# Patient Record
Sex: Male | Born: 1965 | Race: White | Hispanic: No | Smoking: Never smoker
Health system: Southern US, Community
[De-identification: ages and names within clinical notes are randomized; demographics above are authoritative.]

## PROBLEM LIST (undated history)

## (undated) DIAGNOSIS — F32A Depression, unspecified: Secondary | ICD-10-CM

## (undated) DIAGNOSIS — S2239XA Fracture of one rib, unspecified side, initial encounter for closed fracture: Secondary | ICD-10-CM

## (undated) DIAGNOSIS — M1990 Unspecified osteoarthritis, unspecified site: Secondary | ICD-10-CM

## (undated) DIAGNOSIS — Z87442 Personal history of urinary calculi: Secondary | ICD-10-CM

## (undated) DIAGNOSIS — F329 Major depressive disorder, single episode, unspecified: Secondary | ICD-10-CM

## (undated) DIAGNOSIS — I10 Essential (primary) hypertension: Secondary | ICD-10-CM

## (undated) DIAGNOSIS — S2249XA Multiple fractures of ribs, unspecified side, initial encounter for closed fracture: Secondary | ICD-10-CM

## (undated) DIAGNOSIS — K219 Gastro-esophageal reflux disease without esophagitis: Secondary | ICD-10-CM

## (undated) HISTORY — PX: JOINT REPLACEMENT: SHX530

## (undated) HISTORY — PX: OTHER SURGICAL HISTORY: SHX169

## (undated) HISTORY — PX: REFRACTIVE SURGERY: SHX103

## (undated) HISTORY — PX: NASAL SINUS SURGERY: SHX719

---

## 2001-01-06 ENCOUNTER — Inpatient Hospital Stay (HOSPITAL_COMMUNITY): Admission: EM | Admit: 2001-01-06 | Discharge: 2001-01-07 | Payer: Self-pay | Admitting: *Deleted

## 2001-01-06 ENCOUNTER — Inpatient Hospital Stay (HOSPITAL_COMMUNITY): Admission: EM | Admit: 2001-01-06 | Discharge: 2001-01-06 | Payer: Self-pay | Admitting: *Deleted

## 2001-01-17 ENCOUNTER — Inpatient Hospital Stay (HOSPITAL_COMMUNITY): Admission: EM | Admit: 2001-01-17 | Discharge: 2001-01-18 | Payer: Self-pay | Admitting: *Deleted

## 2001-01-24 ENCOUNTER — Inpatient Hospital Stay (HOSPITAL_COMMUNITY): Admission: EM | Admit: 2001-01-24 | Discharge: 2001-01-29 | Payer: Self-pay | Admitting: Psychiatry

## 2001-01-26 ENCOUNTER — Emergency Department (HOSPITAL_COMMUNITY): Admission: EM | Admit: 2001-01-26 | Discharge: 2001-01-26 | Payer: Self-pay | Admitting: *Deleted

## 2013-07-23 ENCOUNTER — Encounter (HOSPITAL_COMMUNITY): Payer: Self-pay | Admitting: Pharmacy Technician

## 2013-07-23 NOTE — Patient Instructions (Signed)
FLEM ENDERLE  07/23/2013   Your procedure is scheduled on:  07/28/13               Surgery 220pm-350pm  Report to [facility] at     1120  AM.  Call this number if you have problems the morning of surgery: [phone]   Remember:             May have clear liquids until 0730am then npo.    Do not eat food after midnite.   Take these medicines the morning of surgery with A SIP OF WATER:    Do not wear jewelry,   Do not wear lotions, powders, or perfumes. .   Men may shave face and neck.  Do not bring valuables to the hospital.  Contacts, dentures or bridgework may not be worn into surgery.  Leave suitcase in the car. After surgery it may be brought to your room.  For patients admitted to the hospital, checkout time is 11:00 AM the day of  discharge.   SEE CHG INSTRUCTION SHEET    Please read over the following fact sheets that you were given: MRSA Information, coughing and deep breathing exercises, leg exercises, Blood Transfusion Fact sheet, Incentive Spirometry Fact Sheet.                 Failure to comply with these instructions may result in cancellation of your surgery.                Patient Signature ____________________________              Nurse Signature _____________________________

## 2013-07-24 ENCOUNTER — Encounter (HOSPITAL_COMMUNITY): Payer: Self-pay

## 2013-07-24 ENCOUNTER — Ambulatory Visit (HOSPITAL_COMMUNITY)
Admission: RE | Admit: 2013-07-24 | Discharge: 2013-07-24 | Disposition: A | Discharge status: Home / Self Care | Payer: BC Managed Care – PPO | Source: Ambulatory Visit | Attending: Orthopedic Surgery | Admitting: Orthopedic Surgery

## 2013-07-24 ENCOUNTER — Other Ambulatory Visit: Payer: Self-pay

## 2013-07-24 ENCOUNTER — Encounter (HOSPITAL_COMMUNITY)
Admission: RE | Admit: 2013-07-24 | Discharge: 2013-07-24 | Disposition: A | Discharge status: Home / Self Care | Payer: BC Managed Care – PPO | Source: Ambulatory Visit | Attending: Orthopedic Surgery | Admitting: Orthopedic Surgery

## 2013-07-24 DIAGNOSIS — I517 Cardiomegaly: Secondary | ICD-10-CM | POA: Insufficient documentation

## 2013-07-24 DIAGNOSIS — Z01818 Encounter for other preprocedural examination: Secondary | ICD-10-CM | POA: Insufficient documentation

## 2013-07-24 DIAGNOSIS — I10 Essential (primary) hypertension: Secondary | ICD-10-CM | POA: Insufficient documentation

## 2013-07-24 DIAGNOSIS — Z01810 Encounter for preprocedural cardiovascular examination: Secondary | ICD-10-CM | POA: Insufficient documentation

## 2013-07-24 DIAGNOSIS — Z0183 Encounter for blood typing: Secondary | ICD-10-CM | POA: Insufficient documentation

## 2013-07-24 DIAGNOSIS — R9431 Abnormal electrocardiogram [ECG] [EKG]: Secondary | ICD-10-CM | POA: Insufficient documentation

## 2013-07-24 DIAGNOSIS — Z01812 Encounter for preprocedural laboratory examination: Secondary | ICD-10-CM | POA: Insufficient documentation

## 2013-07-24 HISTORY — DX: Depression, unspecified: F32.A

## 2013-07-24 HISTORY — DX: Multiple fractures of ribs, unspecified side, initial encounter for closed fracture: S22.49XA

## 2013-07-24 HISTORY — DX: Major depressive disorder, single episode, unspecified: F32.9

## 2013-07-24 HISTORY — DX: Essential (primary) hypertension: I10

## 2013-07-24 HISTORY — DX: Unspecified osteoarthritis, unspecified site: M19.90

## 2013-07-24 HISTORY — DX: Gastro-esophageal reflux disease without esophagitis: K21.9

## 2013-07-24 HISTORY — DX: Fracture of one rib, unspecified side, initial encounter for closed fracture: S22.39XA

## 2013-07-24 LAB — BASIC METABOLIC PANEL
BUN: 7 mg/dL (ref 6–23)
CO2: 28 mEq/L (ref 19–32)
Calcium: 9.5 mg/dL (ref 8.4–10.5)
Chloride: 100 mEq/L (ref 96–112)
Creatinine, Ser: 1.02 mg/dL (ref 0.50–1.35)
GFR calc Af Amer: >90 mL/min (ref >90)
GFR calc non Af Amer: 85 mL/min — ABNORMAL LOW (ref >90)
Glucose, Bld: 90 mg/dL (ref 70–99)
Potassium: 3.7 mEq/L (ref 3.5–5.1)
Sodium: 138 mEq/L (ref 135–145)

## 2013-07-24 LAB — URINALYSIS, ROUTINE W REFLEX MICROSCOPIC
Bilirubin Urine: NEGATIVE
Ketones, ur: NEGATIVE mg/dL
Leukocytes, UA: NEGATIVE
Nitrite: NEGATIVE
Specific Gravity, Urine: 1.025 (ref 1.005–1.030)
Urobilinogen, UA: 0.2 mg/dL (ref 0.0–1.0)
pH: 5.5 (ref 5.0–8.0)

## 2013-07-24 LAB — CBC
HCT: 39 % (ref 39.0–52.0)
MCV: 85.2 fL (ref 78.0–100.0)
Platelets: 235 10*3/uL (ref 150–400)
RBC: 4.58 MIL/uL (ref 4.22–5.81)
WBC: 9 10*3/uL (ref 4.0–10.5)

## 2013-07-24 LAB — ABO/RH: ABO/RH(D): O POS

## 2013-07-24 LAB — SURGICAL PCR SCREEN: MRSA, PCR: NEGATIVE

## 2013-07-24 NOTE — H&P (Signed)
TOTAL HIP ADMISSION H&P  Patient is admitted for left total hip arthroplasty, anterior approach.  Subjective:  Chief Complaint: Left hip AVN / pain  HPI: Raymond [name], 47 y.o. 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  Onset of symptoms was abrupt starting 5 months ago with rapidlly worsening course since that time.The patient noted no past surgery on the left hip(s).  Patient currently rates pain in the left hip at 10 out of 10 with activity. Patient has night pain, worsening of pain with activity and weight bearing, trendelenberg gait, pain that interfers with activities of daily living and pain with passive range of motion. Patient has evidence of periarticular osteophytes, joint space narrowing and AVN by imaging studies. This condition presents safety issues increasing the risk of falls.  There is no current active infection.  Risks, benefits and expectations were discussed with the patient. Patient understand the risks, benefits and expectations and wishes to proceed with surgery.   D/C Plans:   Home with HHPT  Post-op Meds:   Rx given for ASA, Robaxin, Iron, Colace and MiraLax  Tranexamic Acid:   To be given  Decadron:    To be given  FYI:    ASA post-op   Past Medical History  Diagnosis Date  . Hypertension   . Depression   . Kidney stones   . GERD (gastroesophageal reflux disease)   . Arthritis   . Rib fractures     left side 06/2013     Past Surgical History  Procedure Laterality Date  . Kidney stone removal     . Refractive surgery    . Right shoulder surgery     . Nasal sinus surgery       No Known Allergies   History  Substance Use Topics  . Smoking status: Never Smoker   . Smokeless tobacco: Current User    Types: Chew  .  Alcohol Use: Yes     Comment: occASional beer       Review of Systems  Constitutional: Negative.   HENT: Negative.   Eyes: Negative.   Respiratory: Negative.   Cardiovascular: Negative.   Gastrointestinal: Positive for heartburn.  Genitourinary: Negative.   Musculoskeletal: Positive for back pain and joint pain.  Skin: Negative.   Neurological: Negative.   Endo/Heme/Allergies: Negative.   Psychiatric/Behavioral: Negative.     Objective:  Physical Exam  Constitutional: He is oriented to person, place, and time. He appears well-developed and well-nourished.  HENT:  Head: Normocephalic and atraumatic.  Mouth/Throat: Oropharynx is clear and moist. He has dentures.  Eyes: Pupils are equal, round, and reactive to light.  Neck: Neck supple. No JVD present. No tracheal deviation present. No thyromegaly present.  Cardiovascular: Normal rate, regular rhythm, normal heart sounds and intact distal pulses.   Respiratory: Effort normal and breath sounds normal. No stridor. No respiratory distress. He has no wheezes.  GI: Soft. There is no tenderness. There is no guarding.  Musculoskeletal:       Left hip: He exhibits decreased range of motion, decreased strength, tenderness and bony tenderness. He exhibits no swelling, no deformity and no laceration.  Lymphadenopathy:    He has no cervical adenopathy.  Neurological: He is alert and oriented to person, place, and time.  Skin: Skin is warm and  dry.  Psychiatric: He has a normal mood and affect.    Vital signs in last 24 hours: Temp:  [98.3 F (36.8 C)] 98.3 F (36.8 C) (08/08 0946) Pulse Rate:  [94] 94 (08/08 0946) Resp:  [16] 16 (08/08 0946) BP: (149)/(96) 149/96 mmHg (08/08 0946) SpO2:  [97 %] 97 % (08/08 0946) Weight:  [88.27 kg (194 lb 9.6 oz)] 88.27 kg (194 lb 9.6 oz) (08/08 0946)   Imaging Review Plain radiographs demonstrate severe degenerative joint disease of the left hip(s). The bone quality appears to be good for age  and reported activity level.  Assessment/Plan:  End stage arthritis, left hip(s)  The patient history, physical examination, clinical judgement of the provider and imaging studies are consistent with end stage degenerative joint disease of the left hip(s) and total hip arthroplasty is deemed medically necessary. The treatment options including medical management, injection therapy, arthroscopy and arthroplasty were discussed at length. The risks and benefits of total hip arthroplasty were presented and reviewed. The risks due to aseptic loosening, infection, stiffness, dislocation/subluxation,  thromboembolic complications and other imponderables were discussed.  The patient acknowledged the explanation, agreed to proceed with the plan and consent was signed. Patient is being admitted for inpatient treatment for surgery, pain control, PT, OT, prophylactic antibiotics, VTE prophylaxis, progressive ambulation and ADL's and discharge planning.The patient is planning to be discharged home with home health services.    Anastasio Auerbach Anureet Bruington   PAC  07/24/2013, 11:45 AM

## 2013-07-24 NOTE — Progress Notes (Signed)
CxR results faxed via EPIC to Dr Charlann Boxer.

## 2013-07-28 ENCOUNTER — Ambulatory Visit (HOSPITAL_COMMUNITY): Payer: BC Managed Care – PPO

## 2013-07-28 ENCOUNTER — Encounter (HOSPITAL_COMMUNITY): Payer: Self-pay | Admitting: *Deleted

## 2013-07-28 ENCOUNTER — Ambulatory Visit (HOSPITAL_COMMUNITY): Payer: BC Managed Care – PPO | Admitting: Anesthesiology

## 2013-07-28 ENCOUNTER — Inpatient Hospital Stay (HOSPITAL_COMMUNITY)
Admission: RE | Admit: 2013-07-28 | Discharge: 2013-07-29 | DRG: 818 | Disposition: A | Discharge status: Home Health | Payer: BC Managed Care – PPO | Source: Ambulatory Visit | Attending: Orthopedic Surgery | Admitting: Orthopedic Surgery

## 2013-07-28 ENCOUNTER — Encounter (HOSPITAL_COMMUNITY)
Admission: RE | Disposition: A | Discharge status: Home Health | Payer: Self-pay | Source: Ambulatory Visit | Attending: Orthopedic Surgery

## 2013-07-28 ENCOUNTER — Encounter (HOSPITAL_COMMUNITY): Payer: Self-pay | Admitting: Anesthesiology

## 2013-07-28 DIAGNOSIS — I10 Essential (primary) hypertension: Secondary | ICD-10-CM | POA: Diagnosis present

## 2013-07-28 DIAGNOSIS — E663 Overweight: Secondary | ICD-10-CM

## 2013-07-28 DIAGNOSIS — M169 Osteoarthritis of hip, unspecified: Secondary | ICD-10-CM | POA: Diagnosis present

## 2013-07-28 DIAGNOSIS — D62 Acute posthemorrhagic anemia: Secondary | ICD-10-CM | POA: Diagnosis not present

## 2013-07-28 DIAGNOSIS — Z96642 Presence of left artificial hip joint: Secondary | ICD-10-CM

## 2013-07-28 DIAGNOSIS — Z96649 Presence of unspecified artificial hip joint: Secondary | ICD-10-CM

## 2013-07-28 DIAGNOSIS — D500 Iron deficiency anemia secondary to blood loss (chronic): Secondary | ICD-10-CM | POA: Diagnosis not present

## 2013-07-28 DIAGNOSIS — Z87442 Personal history of urinary calculi: Secondary | ICD-10-CM

## 2013-07-28 DIAGNOSIS — M1610 Unilateral primary osteoarthritis, unspecified hip: Secondary | ICD-10-CM | POA: Diagnosis present

## 2013-07-28 DIAGNOSIS — K219 Gastro-esophageal reflux disease without esophagitis: Secondary | ICD-10-CM | POA: Diagnosis present

## 2013-07-28 DIAGNOSIS — Z6825 Body mass index (BMI) 25.0-25.9, adult: Secondary | ICD-10-CM

## 2013-07-28 DIAGNOSIS — F3289 Other specified depressive episodes: Secondary | ICD-10-CM | POA: Diagnosis present

## 2013-07-28 DIAGNOSIS — M87059 Idiopathic aseptic necrosis of unspecified femur: Principal | ICD-10-CM | POA: Diagnosis present

## 2013-07-28 DIAGNOSIS — F329 Major depressive disorder, single episode, unspecified: Secondary | ICD-10-CM | POA: Diagnosis present

## 2013-07-28 HISTORY — PX: TOTAL HIP ARTHROPLASTY: SHX124

## 2013-07-28 LAB — TYPE AND SCREEN: Antibody Screen: NEGATIVE

## 2013-07-28 SURGERY — ARTHROPLASTY, HIP, TOTAL, ANTERIOR APPROACH
Anesthesia: Spinal | Site: Hip | Laterality: Left | Wound class: Clean

## 2013-07-28 MED ORDER — HYDROMORPHONE HCL PF 1 MG/ML IJ SOLN
INTRAMUSCULAR | Status: AC
  Filled 2013-07-28: qty 1

## 2013-07-28 MED ORDER — POLYETHYLENE GLYCOL 3350 17 G PO PACK
17.0000 g | PACK | Freq: Two times a day (BID) | ORAL | Status: DC
  Administered 2013-07-28 – 2013-07-29 (×2): 17 g via ORAL

## 2013-07-28 MED ORDER — CELECOXIB 200 MG PO CAPS
200.0000 mg | ORAL_CAPSULE | Freq: Two times a day (BID) | ORAL | Status: DC
  Administered 2013-07-28 – 2013-07-29 (×2): 200 mg via ORAL
  Filled 2013-07-28 (×3): qty 1

## 2013-07-28 MED ORDER — FLEET ENEMA 7-19 GM/118ML RE ENEM: 1.0000 | ENEMA | Freq: Once | RECTAL | Status: AC | PRN

## 2013-07-28 MED ORDER — CITRIC ACID-SODIUM CITRATE 334-500 MG/5ML PO SOLN
ORAL | Status: AC
  Filled 2013-07-28: qty 15

## 2013-07-28 MED ORDER — EPHEDRINE SULFATE 50 MG/ML IJ SOLN
INTRAMUSCULAR | Status: DC | PRN
  Administered 2013-07-28 (×2): 5 mg via INTRAVENOUS

## 2013-07-28 MED ORDER — METOCLOPRAMIDE HCL 5 MG/ML IJ SOLN
5.0000 mg | Freq: Three times a day (TID) | INTRAMUSCULAR | Status: DC | PRN
Start: 2013-07-28 — End: 2013-07-29

## 2013-07-28 MED ORDER — LACTATED RINGERS IV SOLN: INTRAVENOUS | Status: DC

## 2013-07-28 MED ORDER — DIPHENHYDRAMINE HCL 25 MG PO CAPS: 25.0000 mg | ORAL_CAPSULE | Freq: Four times a day (QID) | ORAL | Status: DC | PRN

## 2013-07-28 MED ORDER — CEFAZOLIN SODIUM-DEXTROSE 2-3 GM-% IV SOLR
2.0000 g | Freq: Four times a day (QID) | INTRAVENOUS | Status: AC
  Administered 2013-07-28 – 2013-07-29 (×2): 2 g via INTRAVENOUS
  Filled 2013-07-28 (×2): qty 50

## 2013-07-28 MED ORDER — METOCLOPRAMIDE HCL 10 MG PO TABS: 5.0000 mg | ORAL_TABLET | Freq: Three times a day (TID) | ORAL | Status: DC | PRN

## 2013-07-28 MED ORDER — METHOCARBAMOL 500 MG PO TABS
500.0000 mg | ORAL_TABLET | Freq: Four times a day (QID) | ORAL | Status: DC | PRN
  Administered 2013-07-29 (×2): 500 mg via ORAL
  Filled 2013-07-28 (×2): qty 1

## 2013-07-28 MED ORDER — CHLORHEXIDINE GLUCONATE 4 % EX LIQD
60.0000 mL | Freq: Once | CUTANEOUS | Status: DC
  Filled 2013-07-28: qty 60

## 2013-07-28 MED ORDER — 0.9 % SODIUM CHLORIDE (POUR BTL) OPTIME
TOPICAL | Status: DC | PRN
  Administered 2013-07-28: 1000 mL

## 2013-07-28 MED ORDER — PHENOL 1.4 % MT LIQD: 1.0000 | OROMUCOSAL | Status: DC | PRN

## 2013-07-28 MED ORDER — DEXTROSE 5 % IV SOLN
500.0000 mg | Freq: Four times a day (QID) | INTRAVENOUS | Status: DC | PRN
  Administered 2013-07-28: 500 mg via INTRAVENOUS
  Filled 2013-07-28 (×2): qty 5

## 2013-07-28 MED ORDER — MEPERIDINE HCL 50 MG/ML IJ SOLN: 6.2500 mg | INTRAMUSCULAR | Status: DC | PRN

## 2013-07-28 MED ORDER — CITRIC ACID-SODIUM CITRATE 334-500 MG/5ML PO SOLN
ORAL | Status: DC | PRN
  Administered 2013-07-28: 15 mL via ORAL

## 2013-07-28 MED ORDER — HYDROMORPHONE HCL PF 1 MG/ML IJ SOLN
0.5000 mg | INTRAMUSCULAR | Status: DC | PRN
  Administered 2013-07-28: 1 mg via INTRAVENOUS
  Administered 2013-07-28: 0.5 mg via INTRAVENOUS
  Administered 2013-07-28 (×2): 1 mg via INTRAVENOUS
  Administered 2013-07-29: 2 mg via INTRAVENOUS
  Filled 2013-07-28 (×3): qty 1
  Filled 2013-07-28: qty 2

## 2013-07-28 MED ORDER — ATORVASTATIN CALCIUM 40 MG PO TABS
40.0000 mg | ORAL_TABLET | Freq: Every evening | ORAL | Status: DC
  Administered 2013-07-28 – 2013-07-29 (×2): 40 mg via ORAL
  Filled 2013-07-28 (×2): qty 1

## 2013-07-28 MED ORDER — BUPIVACAINE HCL (PF) 0.5 % IJ SOLN
INTRAMUSCULAR | Status: DC | PRN
  Administered 2013-07-28: 3 mL

## 2013-07-28 MED ORDER — BUPIVACAINE HCL (PF) 0.5 % IJ SOLN
INTRAMUSCULAR | Status: AC
  Filled 2013-07-28: qty 30

## 2013-07-28 MED ORDER — DEXAMETHASONE SODIUM PHOSPHATE 10 MG/ML IJ SOLN: 10.0000 mg | Freq: Once | INTRAMUSCULAR | Status: DC

## 2013-07-28 MED ORDER — CEFAZOLIN SODIUM-DEXTROSE 2-3 GM-% IV SOLR
2.0000 g | INTRAVENOUS | Status: AC
  Administered 2013-07-28: 2 g via INTRAVENOUS

## 2013-07-28 MED ORDER — PANTOPRAZOLE SODIUM 40 MG PO TBEC
80.0000 mg | DELAYED_RELEASE_TABLET | Freq: Every day | ORAL | Status: DC
  Administered 2013-07-29: 80 mg via ORAL
  Filled 2013-07-28: qty 2

## 2013-07-28 MED ORDER — DEXAMETHASONE SODIUM PHOSPHATE 10 MG/ML IJ SOLN
INTRAMUSCULAR | Status: DC | PRN
  Administered 2013-07-28: 10 mg via INTRAVENOUS

## 2013-07-28 MED ORDER — HYDROCODONE-ACETAMINOPHEN 7.5-325 MG PO TABS
1.0000 | ORAL_TABLET | ORAL | Status: DC
  Administered 2013-07-28 – 2013-07-29 (×4): 2 via ORAL
  Filled 2013-07-28 (×4): qty 2

## 2013-07-28 MED ORDER — PROMETHAZINE HCL 25 MG/ML IJ SOLN: 6.2500 mg | INTRAMUSCULAR | Status: DC | PRN

## 2013-07-28 MED ORDER — SODIUM CHLORIDE 0.9 % IV SOLN
100.0000 mL/h | INTRAVENOUS | Status: DC
  Administered 2013-07-28 – 2013-07-29 (×3): 100 mL/h via INTRAVENOUS
  Filled 2013-07-28 (×9): qty 1000

## 2013-07-28 MED ORDER — MIDAZOLAM HCL 5 MG/5ML IJ SOLN
INTRAMUSCULAR | Status: DC | PRN
  Administered 2013-07-28 (×2): 1 mg via INTRAVENOUS

## 2013-07-28 MED ORDER — DOCUSATE SODIUM 100 MG PO CAPS
100.0000 mg | ORAL_CAPSULE | Freq: Two times a day (BID) | ORAL | Status: DC
  Administered 2013-07-28 – 2013-07-29 (×2): 100 mg via ORAL

## 2013-07-28 MED ORDER — TRANEXAMIC ACID 100 MG/ML IV SOLN
1000.0000 mg | Freq: Once | INTRAVENOUS | Status: AC
  Administered 2013-07-28: 1000 mg via INTRAVENOUS
  Filled 2013-07-28: qty 10

## 2013-07-28 MED ORDER — ONDANSETRON HCL 4 MG PO TABS: 4.0000 mg | ORAL_TABLET | Freq: Four times a day (QID) | ORAL | Status: DC | PRN

## 2013-07-28 MED ORDER — MENTHOL 3 MG MT LOZG: 1.0000 | LOZENGE | OROMUCOSAL | Status: DC | PRN

## 2013-07-28 MED ORDER — BISACODYL 10 MG RE SUPP: 10.0000 mg | Freq: Every day | RECTAL | Status: DC | PRN

## 2013-07-28 MED ORDER — PROPOFOL INFUSION 10 MG/ML OPTIME
INTRAVENOUS | Status: DC | PRN
  Administered 2013-07-28: 75 ug/kg/min via INTRAVENOUS

## 2013-07-28 MED ORDER — PHENYLEPHRINE HCL 10 MG/ML IJ SOLN
INTRAMUSCULAR | Status: DC | PRN
  Administered 2013-07-28: 80 ug via INTRAVENOUS
  Administered 2013-07-28: 40 ug via INTRAVENOUS

## 2013-07-28 MED ORDER — ASPIRIN EC 325 MG PO TBEC
325.0000 mg | DELAYED_RELEASE_TABLET | Freq: Two times a day (BID) | ORAL | Status: DC
  Administered 2013-07-29: 325 mg via ORAL
  Filled 2013-07-28 (×3): qty 1

## 2013-07-28 MED ORDER — DEXAMETHASONE SODIUM PHOSPHATE 10 MG/ML IJ SOLN
10.0000 mg | Freq: Once | INTRAMUSCULAR | Status: AC
  Administered 2013-07-29: 10 mg via INTRAVENOUS
  Filled 2013-07-28: qty 1

## 2013-07-28 MED ORDER — CEFAZOLIN SODIUM-DEXTROSE 2-3 GM-% IV SOLR
INTRAVENOUS | Status: AC
  Filled 2013-07-28: qty 50

## 2013-07-28 MED ORDER — ZOLPIDEM TARTRATE 5 MG PO TABS: 5.0000 mg | ORAL_TABLET | Freq: Every evening | ORAL | Status: DC | PRN

## 2013-07-28 MED ORDER — ALUM & MAG HYDROXIDE-SIMETH 200-200-20 MG/5ML PO SUSP: 30.0000 mL | ORAL | Status: DC | PRN

## 2013-07-28 MED ORDER — ONDANSETRON HCL 4 MG/2ML IJ SOLN
INTRAMUSCULAR | Status: DC | PRN
  Administered 2013-07-28: 4 mg via INTRAVENOUS

## 2013-07-28 MED ORDER — VERAPAMIL HCL ER 100 MG PO CP24: 100.0000 mg | ORAL_CAPSULE | Freq: Every day | ORAL | Status: DC

## 2013-07-28 MED ORDER — VERAPAMIL HCL ER 100 MG PO CP24
100.0000 mg | ORAL_CAPSULE | Freq: Every day | ORAL | Status: DC
  Filled 2013-07-28: qty 1

## 2013-07-28 MED ORDER — HYDROMORPHONE HCL PF 1 MG/ML IJ SOLN
INTRAMUSCULAR | Status: DC | PRN
  Administered 2013-07-28 (×2): 1 mg via INTRAVENOUS

## 2013-07-28 MED ORDER — METOCLOPRAMIDE HCL 5 MG/ML IJ SOLN
INTRAMUSCULAR | Status: DC | PRN
  Administered 2013-07-28: 10 mg via INTRAVENOUS

## 2013-07-28 MED ORDER — PHENYLEPHRINE HCL 10 MG/ML IJ SOLN
20.0000 mg | INTRAVENOUS | Status: DC | PRN
  Administered 2013-07-28: 25 ug/min via INTRAVENOUS

## 2013-07-28 MED ORDER — STERILE WATER FOR IRRIGATION IR SOLN
Status: DC | PRN
  Administered 2013-07-28: 3000 mL

## 2013-07-28 MED ORDER — ALPRAZOLAM 0.5 MG PO TABS
0.5000 mg | ORAL_TABLET | Freq: Once | ORAL | Status: AC
  Administered 2013-07-28: 0.5 mg via ORAL
  Filled 2013-07-28: qty 1

## 2013-07-28 MED ORDER — ONDANSETRON HCL 4 MG/2ML IJ SOLN: 4.0000 mg | Freq: Four times a day (QID) | INTRAMUSCULAR | Status: DC | PRN

## 2013-07-28 MED ORDER — LACTATED RINGERS IV SOLN
INTRAVENOUS | Status: DC
  Administered 2013-07-28: 1000 mL via INTRAVENOUS

## 2013-07-28 MED ORDER — HYDROMORPHONE HCL PF 1 MG/ML IJ SOLN: 0.2500 mg | INTRAMUSCULAR | Status: DC | PRN

## 2013-07-28 MED ORDER — VERAPAMIL HCL ER 120 MG PO TBCR
120.0000 mg | EXTENDED_RELEASE_TABLET | Freq: Every day | ORAL | Status: AC
  Administered 2013-07-28: 120 mg via ORAL
  Filled 2013-07-28: qty 1

## 2013-07-28 MED ORDER — FERROUS SULFATE 325 (65 FE) MG PO TABS
325.0000 mg | ORAL_TABLET | Freq: Three times a day (TID) | ORAL | Status: DC
  Administered 2013-07-28 – 2013-07-29 (×4): 325 mg via ORAL
  Filled 2013-07-28 (×5): qty 1

## 2013-07-28 SURGICAL SUPPLY — 38 items
BAG ZIPLOCK 12X15 (MISCELLANEOUS) ×4 IMPLANT
BLADE SAW SGTL 18X1.27X75 (BLADE) ×2 IMPLANT
CAPT HIP PF COP ×2 IMPLANT
CLOTH BEACON ORANGE TIMEOUT ST (SAFETY) ×2 IMPLANT
DERMABOND ADVANCED (GAUZE/BANDAGES/DRESSINGS) ×1
DERMABOND ADVANCED .7 DNX12 (GAUZE/BANDAGES/DRESSINGS) ×1 IMPLANT
DRAPE C-ARM 42X120 X-RAY (DRAPES) ×2 IMPLANT
DRAPE STERI IOBAN 125X83 (DRAPES) ×2 IMPLANT
DRAPE U-SHAPE 47X51 STRL (DRAPES) ×6 IMPLANT
DRSG AQUACEL AG ADV 3.5X10 (GAUZE/BANDAGES/DRESSINGS) ×2 IMPLANT
DRSG TEGADERM 4X4.75 (GAUZE/BANDAGES/DRESSINGS) ×2 IMPLANT
DURAPREP 26ML APPLICATOR (WOUND CARE) ×2 IMPLANT
ELECT BLADE TIP CTD 4 INCH (ELECTRODE) ×2 IMPLANT
ELECT REM PT RETURN 9FT ADLT (ELECTROSURGICAL) ×2
ELECTRODE REM PT RTRN 9FT ADLT (ELECTROSURGICAL) ×1 IMPLANT
EVACUATOR 1/8 PVC DRAIN (DRAIN) ×2 IMPLANT
FACESHIELD LNG OPTICON STERILE (SAFETY) ×10 IMPLANT
GAUZE SPONGE 2X2 8PLY STRL LF (GAUZE/BANDAGES/DRESSINGS) ×1 IMPLANT
GLOVE BIOGEL PI IND STRL 7.5 (GLOVE) ×1 IMPLANT
GLOVE BIOGEL PI IND STRL 8 (GLOVE) ×1 IMPLANT
GLOVE BIOGEL PI INDICATOR 7.5 (GLOVE) ×1
GLOVE BIOGEL PI INDICATOR 8 (GLOVE) ×1
GLOVE ECLIPSE 8.0 STRL XLNG CF (GLOVE) ×2 IMPLANT
GLOVE ORTHO TXT STRL SZ7.5 (GLOVE) ×4 IMPLANT
GOWN BRE IMP PREV XXLGXLNG (GOWN DISPOSABLE) ×2 IMPLANT
GOWN STRL NON-REIN LRG LVL3 (GOWN DISPOSABLE) ×2 IMPLANT
KIT BASIN OR (CUSTOM PROCEDURE TRAY) ×2 IMPLANT
PACK TOTAL JOINT (CUSTOM PROCEDURE TRAY) ×2 IMPLANT
PADDING CAST COTTON 6X4 STRL (CAST SUPPLIES) ×2 IMPLANT
SPONGE GAUZE 2X2 STER 10/PKG (GAUZE/BANDAGES/DRESSINGS) ×1
SUCTION FRAZIER 12FR DISP (SUCTIONS) ×2 IMPLANT
SUT MNCRL AB 4-0 PS2 18 (SUTURE) ×2 IMPLANT
SUT VIC AB 1 CT1 36 (SUTURE) ×8 IMPLANT
SUT VIC AB 2-0 CT1 27 (SUTURE) ×2
SUT VIC AB 2-0 CT1 TAPERPNT 27 (SUTURE) ×2 IMPLANT
SUT VLOC 180 0 24IN GS25 (SUTURE) ×2 IMPLANT
TOWEL OR 17X26 10 PK STRL BLUE (TOWEL DISPOSABLE) ×4 IMPLANT
TRAY FOLEY CATH 14FRSI W/METER (CATHETERS) ×2 IMPLANT

## 2013-07-28 NOTE — Transfer of Care (Signed)
Immediate Anesthesia Transfer of Care Note  Patient: Raymond [name]  Procedure(s) Performed: Procedure(s): LEFT TOTAL HIP ARTHROPLASTY ANTERIOR APPROACH (Left)  Patient Location: PACU  Anesthesia Type:MAC and Spinal  Level of Consciousness: awake, alert , oriented and patient cooperative  Airway & Oxygen Therapy: Patient Spontanous Breathing and Patient connected to face mask oxygen  Post-op Assessment: Report given to PACU RN, Post -op Vital signs reviewed and stable and Patient moving all extremities  Post vital signs: Reviewed and stable  Complications: No apparent anesthesia complications

## 2013-07-28 NOTE — Preoperative (Signed)
Beta Blockers   Reason not to administer Beta Blockers:Not Applicable, not on home BB 

## 2013-07-28 NOTE — Anesthesia Postprocedure Evaluation (Signed)
  Anesthesia Post-op Note  Patient: Raymond [name]  Procedure(s) Performed: Procedure(s) (LRB): LEFT TOTAL HIP ARTHROPLASTY ANTERIOR APPROACH (Left)  Patient Location: PACU  Anesthesia Type: Spinal  Level of Consciousness: awake and alert   Airway and Oxygen Therapy: Patient Spontanous Breathing  Post-op Pain: mild  Post-op Assessment: Post-op Vital signs reviewed, Patient's Cardiovascular Status Stable, Respiratory Function Stable, Patent Airway and No signs of Nausea or vomiting  Last Vitals:  Filed Vitals:   07/28/13 1700  BP:   Pulse: 87  Temp:   Resp: 16    Post-op Vital Signs: stable   Complications: No apparent anesthesia complications

## 2013-07-28 NOTE — Progress Notes (Signed)
X-ray results noted 

## 2013-07-28 NOTE — Progress Notes (Signed)
Portable AP Pelvis and Lateral Left Hip X-rays done. 

## 2013-07-28 NOTE — Progress Notes (Addendum)
Pharmacy ([facility]) called to say they did not carry appropriate dose of Verapamil for this pts HS dose, and could pt have someone bring in from home. Pt lives in [place] and has noone to bring this in. Spoke with JC in Pharmacy and he suggested a one time administration of an available dosage such as 120 mg. Will page MD/PA-C on call.

## 2013-07-28 NOTE — Anesthesia Preprocedure Evaluation (Addendum)
Anesthesia Evaluation  Patient identified by MRN, date of birth, ID band Patient awake    Reviewed: Allergy & Precautions, H&P , NPO status , Patient's Chart, lab work & pertinent test results  Airway Mallampati: II TM Distance: >3 FB Neck ROM: Full    Dental no notable dental hx.    Pulmonary neg pulmonary ROS,  breath sounds clear to auscultation  Pulmonary exam normal       Cardiovascular hypertension, Rhythm:Regular Rate:Normal     Neuro/Psych negative neurological ROS  negative psych ROS   GI/Hepatic negative GI ROS, Neg liver ROS,   Endo/Other  negative endocrine ROS  Renal/GU negative Renal ROS  negative genitourinary   Musculoskeletal negative musculoskeletal ROS (+)   Abdominal   Peds negative pediatric ROS (+)  Hematology negative hematology ROS (+)   Anesthesia Other Findings   Reproductive/Obstetrics negative OB ROS                           Anesthesia Physical Anesthesia Plan  ASA: II  Anesthesia Plan: Spinal   Post-op Pain Management:    Induction:   Airway Management Planned: Simple Face Mask  Additional Equipment:   Intra-op Plan:   Post-operative Plan:   Informed Consent: I have reviewed the patients History and Physical, chart, labs and discussed the procedure including the risks, benefits and alternatives for the proposed anesthesia with the patient or authorized representative who has indicated his/her understanding and acceptance.   Dental advisory given  Plan Discussed with: CRNA  Anesthesia Plan Comments:         Anesthesia Quick Evaluation

## 2013-07-28 NOTE — Anesthesia Procedure Notes (Signed)
Spinal  Patient location during procedure: OR Staffing Anesthesiologist: Symon Norwood Performed by: anesthesiologist  Preanesthetic Checklist Completed: patient identified, site marked, surgical consent, pre-op evaluation, timeout performed, IV checked, risks and benefits discussed and monitors and equipment checked Spinal Block Patient position: sitting Prep: Betadine Patient monitoring: heart rate, continuous pulse ox and blood pressure Approach: right paramedian Location: L2-3 Injection technique: single-shot Needle Needle type: Spinocan  Needle gauge: 22 G Needle length: 9 cm Additional Notes Expiration date of kit checked and confirmed. Patient tolerated procedure well, without complications.     

## 2013-07-28 NOTE — Interval H&P Note (Signed)
History and Physical Interval Note:  07/28/2013 1:20 PM  Raymond [name]  has presented today for surgery, with the diagnosis of left hip osteoarthritis  The various methods of treatment have been discussed with the patient and family. After consideration of risks, benefits and other options for treatment, the patient has consented to  Procedure(s): LEFT TOTAL HIP ARTHROPLASTY ANTERIOR APPROACH (Left) as a surgical intervention .  The patient's history has been reviewed, patient examined, no change in status, stable for surgery.  I have reviewed the patient's chart and labs.  Questions were answered to the patient's satisfaction.     Shelda Pal

## 2013-07-28 NOTE — Plan of Care (Signed)
Problem: Consults Goal: Diagnosis- Total Joint Replacement Primary Total Hip     

## 2013-07-28 NOTE — Op Note (Signed)
NAME:  Raymond [name]                ACCOUNT NO.: [id number]      MEDICAL RECORD NO.: [id number]      FACILITY:  [facility]      PHYSICIAN:  Durene Romans D  DATE OF BIRTH:  07-21-1966     DATE OF PROCEDURE:  07/28/2013                                 OPERATIVE REPORT         PREOPERATIVE DIAGNOSIS: Left  hip avascular necrosis.      POSTOPERATIVE DIAGNOSIS:  Left hip avascular necrosis.      PROCEDURE:  Left total hip replacement through an anterior approach   utilizing DePuy THR system, component size 54mm pinnacle cup, a size 36+4 neutral   Altrex liner, a size 10 Hi Tri Lock stem with a 36+1.5 delta ceramic   ball.      SURGEON:  Madlyn Frankel. Charlann Boxer, M.D.      ASSISTANT:  Lanney Gins, PA-C     ANESTHESIA:  Spinal.      SPECIMENS:  None.      COMPLICATIONS:  None.      BLOOD LOSS:  250 cc     DRAINS:  One Hemovac.      INDICATION OF THE PROCEDURE:  Raymond [name] is a 47 y.o. male who had   presented to office for evaluation of left hip pain.  Radiographs revealed   femoral head avascular necrosis with evidence of femoral head collapse.  The patient had a very painful limited range of   motion significantly affecting their overall quality of life.  Difficulty with simply walking.  The patient was failing to    respond to conservative measures, and at this point was ready   to proceed with more definitive measures.  The patient has noted these progressive problems and dysfunction   with regarding the hip prior to surgery.  Consent was obtained for   benefit of pain relief.  Specific risk of infection, DVT, component   failure, dislocation, need for revision surgery, as well discussion of   the anterior versus posterior approach were reviewed.  Consent was   obtained for benefit of anterior pain relief through an anterior   approach.      PROCEDURE IN DETAIL:  The patient was brought to operative theater.   Once adequate anesthesia, preoperative  antibiotics, 2gm Ancef administered.   The patient was positioned supine on the OSI Hanna table.  Once adequate   padding of boney process was carried out, we had predraped out the hip, and  used fluoroscopy to confirm orientation of the pelvis and position.      The left hip was then prepped and draped from proximal iliac crest to   mid thigh with shower curtain technique.      Time-out was performed identifying the patient, planned procedure, and   extremity.     An incision was then made 2 cm distal and lateral to the   anterior superior iliac spine extending over the orientation of the   tensor fascia lata muscle and sharp dissection was carried down to the   fascia of the muscle and protractor placed in the soft tissues.      The fascia was then incised.  The muscle belly was identified and swept  laterally and retractor placed along the superior neck.  Following   cauterization of the circumflex vessels and removing some pericapsular   fat, a second cobra retractor was placed on the inferior neck.  A third   retractor was placed on the anterior acetabulum after elevating the   anterior rectus.  A L-capsulotomy was along the line of the   superior neck to the trochanteric fossa, then extended proximally and   distally.  Tag sutures were placed and the retractors were then placed   intracapsular.  We then identified the trochanteric fossa and   orientation of my neck cut, confirmed this radiographically   and then made a neck osteotomy with the femur on traction.  The femoral   head was removed without difficulty or complication.  Traction was let   off and retractors were placed posterior and anterior around the   acetabulum.      The labrum and foveal tissue were debrided.  I began reaming with a 49mm   reamer and reamed up to 53mm reamer with good bony bed preparation and a 54   cup was chosen.  The final 54mm Pinnacle cup was then impacted under fluoroscopy  to confirm the  depth of penetration and orientation with respect to   abduction.  A screw was placed followed by the hole eliminator.  The final   36+4 neutral Altrex liner was impacted with good visualized rim fit.  The cup was positioned anatomically within the acetabular portion of the pelvis.      At this point, the femur was rolled at 80 degrees.  Further capsule was   released off the inferior aspect of the femoral neck.  I then   released the superior capsule proximally.  The hook was placed laterally   along the femur and elevated manually and held in position with the bed   hook.  The leg was then extended and adducted with the leg rolled to 100   degrees of external rotation.  Once the proximal femur was fully   exposed, I used a box osteotome to set orientation.  I then began   broaching with the starting chili pepper broach and passed this by hand and then broached up to 10.  With the 10 broach in place I chose a high offset neck and did a trial reduction.  The offset was appropriate, leg lengths   appeared to be equal, confirmed radiographically.   Given these findings, I went ahead and dislocated the hip, repositioned all   retractors and positioned the right hip in the extended and abducted position.  The final 10 Hi Tri Lock stem was   chosen and it was impacted down to the level of neck cut.  Based on this   and the trial reduction, a 36+1.5 delta ceramic ball was chosen and   impacted onto a clean and dry trunnion, and the hip was reduced.  The   hip had been irrigated throughout the case again at this point.  I did   reapproximate the superior capsular leaflet to the anterior leaflet   using #1 Vicryl, placed a medium Hemovac drain deep.  The fascia of the   tensor fascia lata muscle was then reapproximated using #1 Vicryl.  The   remaining wound was closed with 2-0 Vicryl and running 4-0 Monocryl.   The hip was cleaned, dried, and dressed sterilely using Dermabond and   Aquacel  dressing.  Drain site dressed separately.  She was  then brought   to recovery room in stable condition tolerating the procedure well.    Lanney Gins, PA-C was present for the entirety of the case involved from   preoperative positioning, perioperative retractor management, general   facilitation of the case, as well as primary wound closure as assistant.            Madlyn Frankel Charlann Boxer, M.D.            MDO/MEDQ  D:  10/09/2011  T:  10/09/2011  Job:  [id number]      Electronically Signed by Durene Romans M.D. on 10/15/2011 09:15:38 AM

## 2013-07-29 ENCOUNTER — Encounter (HOSPITAL_COMMUNITY): Payer: Self-pay | Admitting: Orthopedic Surgery

## 2013-07-29 DIAGNOSIS — E663 Overweight: Secondary | ICD-10-CM

## 2013-07-29 DIAGNOSIS — D500 Iron deficiency anemia secondary to blood loss (chronic): Secondary | ICD-10-CM | POA: Diagnosis not present

## 2013-07-29 LAB — CBC
MCH: 26.5 pg (ref 26.0–34.0)
MCV: 86.4 fL (ref 78.0–100.0)
Platelets: 219 10*3/uL (ref 150–400)
RDW: 16.7 % — ABNORMAL HIGH (ref 11.5–15.5)

## 2013-07-29 LAB — BASIC METABOLIC PANEL
CO2: 29 mEq/L (ref 19–32)
Calcium: 9.2 mg/dL (ref 8.4–10.5)
Creatinine, Ser: 1 mg/dL (ref 0.50–1.35)

## 2013-07-29 MED ORDER — FERROUS SULFATE 325 (65 FE) MG PO TABS: 325.0000 mg | ORAL_TABLET | Freq: Three times a day (TID) | ORAL | Status: DC

## 2013-07-29 MED ORDER — DSS 100 MG PO CAPS: 100.0000 mg | ORAL_CAPSULE | Freq: Two times a day (BID) | ORAL | Status: DC

## 2013-07-29 MED ORDER — ASPIRIN 325 MG PO TBEC: 325.0000 mg | DELAYED_RELEASE_TABLET | Freq: Two times a day (BID) | ORAL | Status: DC

## 2013-07-29 MED ORDER — POLYETHYLENE GLYCOL 3350 17 G PO PACK: 17.0000 g | PACK | Freq: Two times a day (BID) | ORAL | Status: DC

## 2013-07-29 MED ORDER — METHOCARBAMOL 500 MG PO TABS: 500.0000 mg | ORAL_TABLET | Freq: Four times a day (QID) | ORAL | Status: DC | PRN

## 2013-07-29 MED ORDER — OXYCODONE HCL 5 MG PO TABS
5.0000 mg | ORAL_TABLET | ORAL | Status: DC
  Administered 2013-07-29 (×2): 10 mg via ORAL
  Filled 2013-07-29 (×2): qty 2

## 2013-07-29 MED ORDER — OXYCODONE HCL 5 MG PO TABS: 5.0000 mg | ORAL_TABLET | ORAL | Status: DC | PRN

## 2013-07-29 NOTE — Progress Notes (Signed)
[facility]   [facility] is providing the following services: RW and Commode  If patient discharges after hours, please call [phone].   Renard Hamper 07/29/2013, 8:54 AM

## 2013-07-29 NOTE — Progress Notes (Signed)
Pt for d/c home today with [facility] PT after 2 sessions of PT & 1 OT and after new pain med had managed his pain better. IV d/c'd. Aquacel Dressing intact to L hip area; slight stain noted to [facility] site with gauze & tegaderm on.  RW & 3- in- 1 BSC delivered at bedside for home use. Pt had been voiding well after foley d/c'd this am. Tolerating diet well. D/C instructions & RX given with verbalized understanding. Mother at bedside to assist with d/c. Family took Oxy IR RX & DME's home prior to pt d/c'ing.

## 2013-07-29 NOTE — Care Management Note (Signed)
    Page 1 of 2   07/29/2013     5:13:08 PM   CARE MANAGEMENT NOTE 07/29/2013  Patient:  Raymond [name], Raymond [name]   Account Number:  [id number]  Date Initiated:  07/29/2013  Documentation initiated by:  Colleen Can  Subjective/Objective Assessment:   dx left hip replacemnt    Pre-arange with Genevieve Norlander who will start [facility] services on tomorrow     Action/Plan:   CM spoke with patient. Plans are for him to return to his home in [place] where his mother will be caregiver.   Anticipated DC Date:  07/29/2013   Anticipated DC Plan:  HOME W HOME HEALTH SERVICES      DC Planning Services  CM consult      PAC Choice  DURABLE MEDICAL EQUIPMENT  HOME HEALTH   Choice offered to / List presented to:  C-1 Patient   DME arranged  3-N-1  Levan Hurst      DME agency  [facility]     [facility] arranged  HH-2 PT      Status of service:  Completed, signed off Medicare Important Message given?   (If response is "NO", the following Medicare IM given date fields will be blank) Date Medicare IM given:   Date Additional Medicare IM given:    Discharge Disposition:  HOME W HOME HEALTH SERVICES  Per UR Regulation:  Reviewed for med. necessity/level of care/duration of stay  If discussed at Long Length of Stay Meetings, dates discussed:    Comments:

## 2013-07-29 NOTE — Progress Notes (Signed)
Physical Therapy Treatment Patient Details Name: Raymond [name] MRN: [id number] DOB: 1966-02-19 Today's Date: 07/29/2013 Time: 1610-1630 PT Time Calculation (min): 20 min  PT Assessment / Plan / Recommendation  History of Present Illness L THA, Aa   PT Comments     Follow Up Recommendations  Home health PT     Does the patient have the potential to tolerate intense rehabilitation     Barriers to Discharge        Equipment Recommendations  Rolling walker with 5" wheels    Recommendations for Other Services OT consult  Frequency 7X/week   Progress towards PT Goals Progress towards PT goals: Progressing toward goals  Plan Current plan remains appropriate    Precautions / Restrictions Precautions Precautions: Fall Restrictions Weight Bearing Restrictions: No   Pertinent Vitals/Pain 3/10; premed, ice packs provided    Mobility  Bed Mobility Bed Mobility: Supine to Sit;Sit to Supine Supine to Sit: 4: Min guard Sit to Supine: 4: Min guard Details for Bed Mobility Assistance: cues for sequence and use of R LE to self assist Transfers Transfers: Sit to Stand;Stand to Sit Sit to Stand: 4: Min guard Stand to Sit: 4: Min guard Details for Transfer Assistance: cues for LE management and hand placement Ambulation/Gait Ambulation/Gait Assistance: 4: Min guard;5: Supervision Ambulation Distance (Feet): 200 Feet Assistive device: Rolling walker Ambulation/Gait Assistance Details: cues for initial sequence, posture and position from RW Gait Pattern: Step-to pattern;Step-through pattern General Gait Details: progressing to recip gait Stairs: Yes Stairs Assistance: 4: Min guard Stair Management Technique: Two rails;Forwards;Step to pattern Number of Stairs: 4    Exercises     PT Diagnosis:    PT Problem List:   PT Treatment Interventions:     PT Goals (current goals can now be found in the care plan section) Acute Rehab PT Goals Patient Stated Goal: Resume previous  lifestyle with decreased pain PT Goal Formulation: With patient Time For Goal Achievement: 08/05/13 Potential to Achieve Goals: Good  Visit Information  Last PT Received On: 07/29/13 Assistance Needed: +1 History of Present Illness: L THA, Aa    Subjective Data  Subjective: I want to go home today Patient Stated Goal: Resume previous lifestyle with decreased pain   Cognition  Cognition Arousal/Alertness: Awake/alert Behavior During Therapy: WFL for tasks assessed/performed Overall Cognitive Status: Within Functional Limits for tasks assessed    Balance     End of Session PT - End of Session Equipment Utilized During Treatment: Gait belt Activity Tolerance: Patient tolerated treatment well Patient left: in bed;with call bell/phone within reach;with family/visitor present Nurse Communication: Mobility status   GP     Raymond [name] 07/29/2013, 5:33 PM

## 2013-07-29 NOTE — Evaluation (Signed)
Physical Therapy Evaluation Patient Details Name: Raymond [name] MRN: [id number] DOB: [birth year] Today's Date: 07/29/2013 Time: 1206-1232 PT Time Calculation (min): 26 min  PT Assessment / Plan / Recommendation History of Present Illness     Clinical Impression  Pt s/p L THR presents with decreased L LE strength/ROM and post op pain limiting functional mobility    PT Assessment  Patient needs continued PT services    Follow Up Recommendations  Home health PT    Does the patient have the potential to tolerate intense rehabilitation      Barriers to Discharge        Equipment Recommendations  Rolling walker with 5" wheels    Recommendations for Other Services OT consult   Frequency 7X/week    Precautions / Restrictions Precautions Precautions: Fall Restrictions Weight Bearing Restrictions: No   Pertinent Vitals/Pain 5/10; premed, ice pack provided      Mobility  Bed Mobility Bed Mobility: Supine to Sit Supine to Sit: 4: Min assist Details for Bed Mobility Assistance: cues for sequence and use of R LE to self assist Transfers Transfers: Sit to Stand;Stand to Sit Sit to Stand: 4: Min assist Stand to Sit: 4: Min assist Details for Transfer Assistance: cues for LE management and use of UEs to self assist Ambulation/Gait Ambulation/Gait Assistance: 4: Min assist Ambulation Distance (Feet): 90 Feet Assistive device: Rolling walker Ambulation/Gait Assistance Details: cues for sequence, posture, stride length and position from RW Gait Pattern: Step-to pattern;Decreased step length - right;Decreased step length - left;Trunk flexed Stairs: No    Exercises Total Joint Exercises Ankle Circles/Pumps: AROM;10 reps;Supine;Both Quad Sets: AROM;Both;10 reps;Supine Heel Slides: AAROM;15 reps;Supine;Left Hip ABduction/ADduction: AAROM;15 reps;Left;Supine   PT Diagnosis: Difficulty walking  PT Problem List: Decreased strength;Decreased range of motion;Decreased activity  tolerance;Decreased balance;Decreased mobility;Decreased knowledge of use of DME;Pain PT Treatment Interventions: DME instruction;Gait training;Stair training;Functional mobility training;Therapeutic activities;Therapeutic exercise;Patient/family education     PT Goals(Current goals can be found in the care plan section) Acute Rehab PT Goals Patient Stated Goal: Resume previous lifestyle with decreased pain PT Goal Formulation: With patient Time For Goal Achievement: 08/05/13 Potential to Achieve Goals: Good  Visit Information  Last PT Received On: 07/29/13 Assistance Needed: +1       Prior Functioning  Home Living Family/patient expects to be discharged to:: Private residence Living Arrangements: Alone Available Help at Discharge: Family Type of Home: House Home Access: Stairs to enter Secretary/administrator of Steps: 4 Entrance Stairs-Rails: Right;Left;Can reach both Home Layout: One level Home Equipment: Crutches Additional Comments: Mother lives next door Prior Function Level of Independence: Independent;Independent with assistive device(s) Comments: hx of falls with hip giving way Communication Communication: No difficulties    Cognition  Cognition Arousal/Alertness: Awake/alert Behavior During Therapy: WFL for tasks assessed/performed Overall Cognitive Status: Within Functional Limits for tasks assessed    Extremity/Trunk Assessment Upper Extremity Assessment Upper Extremity Assessment: Overall WFL for tasks assessed Lower Extremity Assessment Lower Extremity Assessment: LLE deficits/detail LLE Deficits / Details: hip strength 2+/5 with AAROM at hip to 75 flex and 15 abd Cervical / Trunk Assessment Cervical / Trunk Assessment: Normal   Balance    End of Session PT - End of Session Equipment Utilized During Treatment: Gait belt Activity Tolerance: Patient tolerated treatment well Patient left: in chair;with call bell/phone within reach;with family/visitor  present Nurse Communication: Mobility status  GP     Masako Overall 07/29/2013, 1:44 PM

## 2013-07-29 NOTE — Progress Notes (Signed)
   Subjective: 1 Day Post-Op Procedure(s) (LRB): LEFT TOTAL HIP ARTHROPLASTY ANTERIOR APPROACH (Left)   Patient reports pain as moderate, pain semi controlled. He said that the pain got under control in the middle of the night, but still says it is a 6-7 / 10. He does say the hip feels better, but there is soreness on the anterior hip.  Will change his pain medication. He is ready to be discharged home if he does well with PT and the pain stays well controlled.   Objective:   VITALS:   Filed Vitals:   07/29/13  BP: 134/78  Pulse: 83  Temp: 98.4 F (36.9 C)   Resp: 16    Neurovascular intact Dorsiflexion/Plantar flexion intact Incision: dressing C/D/I No cellulitis present Compartment soft  LABS  Recent Labs  07/29/13 0340  HGB 11.1*  HCT 36.2*  WBC 11.3*  PLT 219     Recent Labs  07/29/13 0340  NA 137  K 4.3  BUN 8  CREATININE 1.00  GLUCOSE 147*     Assessment/Plan: 1 Day Post-Op Procedure(s) (LRB): LEFT TOTAL HIP ARTHROPLASTY ANTERIOR APPROACH (Left) HV drain d/c'ed Foley cath d/c'ed Advance diet Up with therapy D/C IV fluids Discharge home with home health if he does well with PT and pain stays controlled.  Follow up in 2 weeks at [facility]. Follow up with OLIN,Calani Gick D in 2 weeks.  Contact information:  [facility] [address] [place] [address] [phone]    Expected ABLA  Treated with iron and will observe  Overweight (BMI 25-29.9) Estimated body mass index is 26.39 kg/(m^2) as calculated from the following:   Height as of this encounter: 6' (1.829 m).   Weight as of this encounter: 88.27 kg (194 lb 9.6 oz). Patient also counseled that weight may inhibit the healing process Patient counseled that losing weight will help with future health issues        Anastasio Auerbach. Flynn Lininger   PAC  07/29/2013, 10:25 AM

## 2013-07-29 NOTE — Evaluation (Signed)
Occupational Therapy Evaluation Patient Details Name: Raymond [name] MRN: [id number] DOB: Apr 11, 1966 Today's Date: 07/29/2013 Time: 4098-1191 OT Time Calculation (min): 29 min  OT Assessment / Plan / Recommendation History of present illness L THA, Aa   Clinical Impression   Pt and mother educated in use of DME, AE, and in safety.  Pt wants to d/c home as soon as possible.  He is confident he will be able to manage as he could tolerate minimal weight bearing on the L LE before surgery.  Mother will assist pt as needed and pt may stay with her if he is having difficulty managing at his home.  No further OT needs.    OT Assessment  Patient does not need any further OT services    Follow Up Recommendations  No OT follow up    Barriers to Discharge      Equipment Recommendations  3 in 1 bedside comode    Recommendations for Other Services    Frequency       Precautions / Restrictions Precautions Precautions: Fall Restrictions Weight Bearing Restrictions: No   Pertinent Vitals/Pain 3/10, L hip, premedicated, repositioned, iced    ADL  Eating/Feeding: Independent Where Assessed - Eating/Feeding: Chair Grooming: Supervision/safety;Wash/dry hands Where Assessed - Grooming: Unsupported standing Upper Body Bathing: Set up Where Assessed - Upper Body Bathing: Unsupported sitting Lower Body Bathing: Minimal assistance Where Assessed - Lower Body Bathing: Unsupported sitting;Supported sit to stand Upper Body Dressing: Set up Where Assessed - Upper Body Dressing: Unsupported sitting;Supported sit to stand Lower Body Dressing: Minimal assistance Where Assessed - Lower Body Dressing: Unsupported sitting;Supported sit to stand Toilet Transfer: Min Pension scheme manager Method: Sit to Barista: Bedside commode Equipment Used: Rolling walker;Reacher;Long-handled sponge;Long-handled shoe horn;Sock aid;Gait belt ADL Comments: educated in use of AE for LB ADL and  in use of 3 in1 over toilet.  Mother has a walk in tub at her home with built in seat and a shower seat and reacher pt may use.  Pt is confident he will be able to step over edge of tub as he was able to manage prior to admission when only able to place minimal weight on his L LE.    OT Diagnosis:    OT Problem List:   OT Treatment Interventions:     OT Goals(Current goals can be found in the care plan section) Acute Rehab OT Goals Patient Stated Goal: Resume previous lifestyle with decreased pain  Visit Information  Last OT Received On: 07/29/13 Assistance Needed: +1 History of Present Illness: L THA, Aa       Prior Functioning     Home Living Family/patient expects to be discharged to:: Private residence Living Arrangements: Alone Available Help at Discharge: Family Type of Home: House Home Access: Stairs to enter Secretary/administrator of Steps: 4 Entrance Stairs-Rails: Right;Left;Can reach both Home Layout: One level Home Equipment: Crutches Additional Comments: Mother lives next door Prior Function Level of Independence: Independent;Independent with assistive device(s) Comments: hx of falls with hip giving way Communication Communication: No difficulties Dominant Hand: Right         Vision/Perception Vision - History Patient Visual Report: No change from baseline   Cognition  Cognition Arousal/Alertness: Awake/alert Behavior During Therapy: WFL for tasks assessed/performed Overall Cognitive Status: Within Functional Limits for tasks assessed    Extremity/Trunk Assessment Upper Extremity Assessment Upper Extremity Assessment: Overall WFL for tasks assessed Lower Extremity Assessment Lower Extremity Assessment: Defer to PT evaluation LLE Deficits /  Details: hip strength 2+/5 with AAROM at hip to 75 flex and 15 abd Cervical / Trunk Assessment Cervical / Trunk Assessment: Normal     Mobility Bed Mobility Bed Mobility: Sit to Supine Sit to Supine: 4: Min  assist Details for Bed Mobility Assistance: cues for sequence and use of R LE to self assist Transfers Sit to Stand: 4: Min assist Stand to Sit: 4: Min assist Details for Transfer Assistance: cues for LE management and hand placement     Exercise    Balance     End of Session OT - End of Session Activity Tolerance: Patient tolerated treatment well Patient left: in bed;with call bell/phone within reach;with family/visitor present  GO     Raymond, [name] 07/29/2013, 2:55 PM [phone]

## 2013-07-30 NOTE — Discharge Summary (Signed)
Physician Discharge Summary  Patient ID: Raymond [name] MRN: [id number] DOB/AGE: 48/20/1967 48 y.o.  Admit date: 07/28/2013 Discharge date: 07/29/2013   Procedures:  Procedure(s) (LRB): LEFT TOTAL HIP ARTHROPLASTY ANTERIOR APPROACH (Left)  Attending Physician:  Dr. Durene Romans   Admission Diagnoses:   Left hip AVN / pain  Discharge Diagnoses:  Principal Problem:   S/P left THA, AA Active Problems:   Overweight (BMI 25.0-29.9)   Expected blood loss anemia  Past Medical History  Diagnosis Date  . Hypertension   . Depression   . Kidney stones   . GERD (gastroesophageal reflux disease)   . Arthritis   . Rib fractures     left side 06/2013     HPI: Raymond [name], 47 y.o. 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 Onset of symptoms was abrupt starting 5 months ago with rapidlly worsening course since that time.The patient noted no past surgery on the left hip(s). Patient currently rates pain in the left hip at 10 out of 10 with activity. Patient has night pain, worsening of pain with activity and weight bearing, trendelenberg gait, pain that interfers with activities of daily living and pain with passive range of motion. Patient has evidence of periarticular osteophytes, joint space narrowing and AVN by imaging studies. This condition presents safety issues increasing the risk of falls. There is no current active infection. Risks, benefits and expectations were discussed with the patient. Patient understand the risks, benefits and expectations and wishes to proceed with surgery.   PCP: Irena Reichmann, DO   Discharged Condition: good  Hospital Course:  Patient underwent the above stated procedure on 07/28/2013. Patient tolerated the procedure well and  brought to the recovery room in good condition and subsequently to the floor.  POD #1 BP: 134/78 ; Pulse: 83 ; Temp: 98.4 F (36.9 C) ; Resp: 16  Pt's foley was removed, as well as the hemovac drain removed. IV was changed to a saline lock. Patient reports pain as moderate, pain semi controlled. He said that the pain got under control in the middle of the night, but still says it is a 6-7 / 10. He does say the hip feels better, but there is soreness on the anterior hip. Will change his pain medication. He is ready to be discharged home  Neurovascular intact, dorsiflexion/plantar flexion intact, incision: dressing C/D/I, no cellulitis present and compartment soft.   LABS  Basename    HGB  11.1  HCT  36.2    Discharge Exam: General appearance: alert, cooperative and no distress Extremities: Homans sign is negative, no sign of DVT, no edema, redness or tenderness in the calves or thighs and no ulcers, gangrene or trophic changes  Disposition:   Home or Self Care with follow up in 2 weeks   Discharge Orders   Future Orders Complete By Expires   Call MD / Call 911  As directed    Comments:     If you experience chest pain or shortness of breath, CALL 911 and be transported to the hospital emergency room.  If you develope a fever above 101 F, pus (white drainage) or increased drainage or redness at the wound, or calf pain, call your surgeon's office.   Change dressing  As directed    Comments:  Maintain surgical dressing for 10-14 days, then replace with 4x4 guaze and tape. Keep the area dry and clean.   Constipation Prevention  As directed    Comments:     Drink plenty of fluids.  Prune juice may be helpful.  You may use a stool softener, such as Colace (over the counter) 100 mg twice a day.  Use MiraLax (over the counter) for constipation as needed.   Diet - low sodium heart healthy  As directed    Discharge instructions  As directed    Comments:     Maintain surgical dressing for  10-14 days, then replace with gauze and tape. Keep the area dry and clean until follow up. Follow up in 2 weeks at [facility]. Call with any questions or concerns.   Increase activity slowly as tolerated  As directed    TED hose  As directed    Comments:     Use stockings (TED hose) for 2 weeks on both leg(s).  You may remove them at night for sleeping.   Weight bearing as tolerated  As directed         Medication List    STOP taking these medications       ibuprofen 200 MG tablet  Commonly known as:  ADVIL,MOTRIN     indomethacin 50 MG capsule  Commonly known as:  INDOCIN     traMADol 50 MG tablet  Commonly known as:  ULTRAM      TAKE these medications       aspirin 325 MG EC tablet  Take 1 tablet (325 mg total) by mouth 2 (two) times daily.     atorvastatin 40 MG tablet  Commonly known as:  LIPITOR  Take 40 mg by mouth every morning.     DSS 100 MG Caps  Take 100 mg by mouth 2 (two) times daily.     ferrous sulfate 325 (65 FE) MG tablet  Take 1 tablet (325 mg total) by mouth 3 (three) times daily after meals.     lisinopril 40 MG tablet  Commonly known as:  PRINIVIL,ZESTRIL  Take 40 mg by mouth every morning.     methocarbamol 500 MG tablet  Commonly known as:  ROBAXIN  Take 1 tablet (500 mg total) by mouth every 6 (six) hours as needed (muscle spasms).     omeprazole 40 MG capsule  Commonly known as:  PRILOSEC  Take 40 mg by mouth daily.     oxyCODONE 5 MG immediate release tablet  Commonly known as:  Oxy IR/ROXICODONE  Take 1-3 tablets (5-15 mg total) by mouth every 4 (four) hours as needed for pain.     polyethylene glycol packet  Commonly known as:  MIRALAX / GLYCOLAX  Take 17 g by mouth 2 (two) times daily.     verapamil 100 MG 24 hr capsule  Commonly known as:  VERELAN  Take 100 mg by mouth at bedtime.         Signed: Anastasio Auerbach. Shelanda Duvall   PAC  07/30/2013, 11:17 AM

## 2013-08-02 ENCOUNTER — Inpatient Hospital Stay (HOSPITAL_COMMUNITY)
Admission: EM | Admit: 2013-08-02 | Discharge: 2013-08-06 | DRG: 440 | Disposition: A | Discharge status: Home Health | Payer: BC Managed Care – PPO | Attending: Orthopedic Surgery | Admitting: Orthopedic Surgery

## 2013-08-02 ENCOUNTER — Encounter (HOSPITAL_COMMUNITY): Payer: Self-pay | Admitting: Emergency Medicine

## 2013-08-02 ENCOUNTER — Emergency Department (HOSPITAL_COMMUNITY): Payer: BC Managed Care – PPO

## 2013-08-02 DIAGNOSIS — T8140XA Infection following a procedure, unspecified, initial encounter: Secondary | ICD-10-CM | POA: Diagnosis present

## 2013-08-02 DIAGNOSIS — L02419 Cutaneous abscess of limb, unspecified: Secondary | ICD-10-CM | POA: Diagnosis present

## 2013-08-02 DIAGNOSIS — Y831 Surgical operation with implant of artificial internal device as the cause of abnormal reaction of the patient, or of later complication, without mention of misadventure at the time of the procedure: Secondary | ICD-10-CM | POA: Diagnosis present

## 2013-08-02 DIAGNOSIS — Z96649 Presence of unspecified artificial hip joint: Secondary | ICD-10-CM

## 2013-08-02 DIAGNOSIS — Z79899 Other long term (current) drug therapy: Secondary | ICD-10-CM

## 2013-08-02 DIAGNOSIS — D62 Acute posthemorrhagic anemia: Secondary | ICD-10-CM | POA: Diagnosis not present

## 2013-08-02 DIAGNOSIS — K219 Gastro-esophageal reflux disease without esophagitis: Secondary | ICD-10-CM | POA: Diagnosis present

## 2013-08-02 DIAGNOSIS — F3289 Other specified depressive episodes: Secondary | ICD-10-CM | POA: Diagnosis present

## 2013-08-02 DIAGNOSIS — D500 Iron deficiency anemia secondary to blood loss (chronic): Secondary | ICD-10-CM | POA: Diagnosis present

## 2013-08-02 DIAGNOSIS — I10 Essential (primary) hypertension: Secondary | ICD-10-CM | POA: Diagnosis present

## 2013-08-02 DIAGNOSIS — Z6826 Body mass index (BMI) 26.0-26.9, adult: Secondary | ICD-10-CM

## 2013-08-02 DIAGNOSIS — E663 Overweight: Secondary | ICD-10-CM | POA: Diagnosis present

## 2013-08-02 DIAGNOSIS — IMO0002 Reserved for concepts with insufficient information to code with codable children: Principal | ICD-10-CM | POA: Diagnosis present

## 2013-08-02 DIAGNOSIS — F329 Major depressive disorder, single episode, unspecified: Secondary | ICD-10-CM | POA: Diagnosis present

## 2013-08-02 DIAGNOSIS — L03119 Cellulitis of unspecified part of limb: Secondary | ICD-10-CM

## 2013-08-02 LAB — BASIC METABOLIC PANEL
BUN: 11 mg/dL (ref 6–23)
Creatinine, Ser: 0.8 mg/dL (ref 0.50–1.35)
GFR calc non Af Amer: >90 mL/min (ref >90)
Glucose, Bld: 132 mg/dL — ABNORMAL HIGH (ref 70–99)
Potassium: 3.7 mEq/L (ref 3.5–5.1)

## 2013-08-02 LAB — CBC WITH DIFFERENTIAL/PLATELET
Basophils Relative: 0 % (ref 0–1)
Eosinophils Absolute: 0 10*3/uL (ref 0.0–0.7)
Eosinophils Relative: 0 % (ref 0–5)
HCT: 35.4 % — ABNORMAL LOW (ref 39.0–52.0)
Hemoglobin: 11.2 g/dL — ABNORMAL LOW (ref 13.0–17.0)
Lymphs Abs: 1.7 10*3/uL (ref 0.7–4.0)
MCH: 26.9 pg (ref 26.0–34.0)
MCHC: 31.6 g/dL (ref 30.0–36.0)
MCV: 84.9 fL (ref 78.0–100.0)
Monocytes Absolute: 1.8 10*3/uL — ABNORMAL HIGH (ref 0.1–1.0)
Monocytes Relative: 15 % — ABNORMAL HIGH (ref 3–12)
Neutro Abs: 8.3 10*3/uL — ABNORMAL HIGH (ref 1.7–7.7)
Neutrophils Relative %: 70 % (ref 43–77)
RBC: 4.17 MIL/uL — ABNORMAL LOW (ref 4.22–5.81)

## 2013-08-02 MED ORDER — ONDANSETRON HCL 4 MG/2ML IJ SOLN: 4.0000 mg | Freq: Four times a day (QID) | INTRAMUSCULAR | Status: DC | PRN

## 2013-08-02 MED ORDER — HYDROMORPHONE HCL PF 1 MG/ML IJ SOLN
1.0000 mg | Freq: Once | INTRAMUSCULAR | Status: AC
  Administered 2013-08-02: 1 mg via INTRAVENOUS
  Filled 2013-08-02: qty 1

## 2013-08-02 MED ORDER — BISACODYL 10 MG RE SUPP: 10.0000 mg | Freq: Every day | RECTAL | Status: DC | PRN

## 2013-08-02 MED ORDER — ALUM & MAG HYDROXIDE-SIMETH 200-200-20 MG/5ML PO SUSP: 30.0000 mL | Freq: Four times a day (QID) | ORAL | Status: DC | PRN

## 2013-08-02 MED ORDER — PANTOPRAZOLE SODIUM 40 MG PO TBEC
80.0000 mg | DELAYED_RELEASE_TABLET | Freq: Every day | ORAL | Status: DC
  Administered 2013-08-02 – 2013-08-06 (×4): 80 mg via ORAL
  Filled 2013-08-02 (×6): qty 2

## 2013-08-02 MED ORDER — HYDROMORPHONE HCL PF 1 MG/ML IJ SOLN
0.5000 mg | INTRAMUSCULAR | Status: DC | PRN
  Administered 2013-08-02 – 2013-08-03 (×7): 1 mg via INTRAVENOUS
  Filled 2013-08-02 (×7): qty 1

## 2013-08-02 MED ORDER — METHOCARBAMOL 500 MG PO TABS
500.0000 mg | ORAL_TABLET | Freq: Four times a day (QID) | ORAL | Status: DC | PRN
  Administered 2013-08-03 – 2013-08-06 (×3): 500 mg via ORAL
  Filled 2013-08-02 (×3): qty 1

## 2013-08-02 MED ORDER — ASPIRIN EC 325 MG PO TBEC
325.0000 mg | DELAYED_RELEASE_TABLET | Freq: Two times a day (BID) | ORAL | Status: DC
  Administered 2013-08-02: 325 mg via ORAL
  Filled 2013-08-02 (×5): qty 1

## 2013-08-02 MED ORDER — OXYCODONE HCL 5 MG PO TABS
5.0000 mg | ORAL_TABLET | ORAL | Status: DC | PRN
  Administered 2013-08-03: 5 mg via ORAL
  Administered 2013-08-04 (×2): 15 mg via ORAL
  Administered 2013-08-04 (×2): 10 mg via ORAL
  Administered 2013-08-05 – 2013-08-06 (×3): 15 mg via ORAL
  Filled 2013-08-02: qty 2
  Filled 2013-08-02 (×3): qty 3
  Filled 2013-08-02: qty 1
  Filled 2013-08-02 (×2): qty 3
  Filled 2013-08-02: qty 2

## 2013-08-02 MED ORDER — SODIUM CHLORIDE 0.9 % IV SOLN
INTRAVENOUS | Status: DC
  Administered 2013-08-02 – 2013-08-04 (×3): via INTRAVENOUS
  Administered 2013-08-05: 1000 mL via INTRAVENOUS
  Administered 2013-08-05 – 2013-08-06 (×2): via INTRAVENOUS

## 2013-08-02 MED ORDER — ATORVASTATIN CALCIUM 40 MG PO TABS
40.0000 mg | ORAL_TABLET | Freq: Every morning | ORAL | Status: DC
  Administered 2013-08-04 – 2013-08-06 (×3): 40 mg via ORAL
  Filled 2013-08-02 (×5): qty 1

## 2013-08-02 MED ORDER — DOCUSATE SODIUM 100 MG PO CAPS
100.0000 mg | ORAL_CAPSULE | Freq: Two times a day (BID) | ORAL | Status: DC
  Administered 2013-08-02 – 2013-08-06 (×6): 100 mg via ORAL
  Filled 2013-08-02 (×10): qty 1

## 2013-08-02 MED ORDER — ZOLPIDEM TARTRATE 5 MG PO TABS
5.0000 mg | ORAL_TABLET | Freq: Every evening | ORAL | Status: DC | PRN
  Administered 2013-08-04 – 2013-08-05 (×3): 5 mg via ORAL
  Filled 2013-08-02 (×3): qty 1

## 2013-08-02 MED ORDER — LISINOPRIL 40 MG PO TABS
40.0000 mg | ORAL_TABLET | Freq: Every morning | ORAL | Status: DC
  Administered 2013-08-04 – 2013-08-06 (×3): 40 mg via ORAL
  Filled 2013-08-02 (×5): qty 1

## 2013-08-02 MED ORDER — POLYETHYLENE GLYCOL 3350 17 G PO PACK
17.0000 g | PACK | Freq: Two times a day (BID) | ORAL | Status: DC
  Administered 2013-08-02 – 2013-08-06 (×3): 17 g via ORAL
  Filled 2013-08-02 (×9): qty 1

## 2013-08-02 MED ORDER — FERROUS SULFATE 325 (65 FE) MG PO TABS
325.0000 mg | ORAL_TABLET | Freq: Three times a day (TID) | ORAL | Status: DC
  Administered 2013-08-02 – 2013-08-06 (×8): 325 mg via ORAL
  Filled 2013-08-02 (×14): qty 1

## 2013-08-02 MED ORDER — CEFAZOLIN SODIUM-DEXTROSE 2-3 GM-% IV SOLR
2.0000 g | Freq: Three times a day (TID) | INTRAVENOUS | Status: DC
  Administered 2013-08-02 – 2013-08-06 (×12): 2 g via INTRAVENOUS
  Filled 2013-08-02 (×13): qty 50

## 2013-08-02 MED ORDER — ONDANSETRON HCL 4 MG PO TABS: 4.0000 mg | ORAL_TABLET | Freq: Four times a day (QID) | ORAL | Status: DC | PRN

## 2013-08-02 MED ORDER — ACETAMINOPHEN 325 MG PO TABS
650.0000 mg | ORAL_TABLET | Freq: Four times a day (QID) | ORAL | Status: DC | PRN
  Administered 2013-08-02: 650 mg via ORAL
  Filled 2013-08-02: qty 2

## 2013-08-02 MED ORDER — FLEET ENEMA 7-19 GM/118ML RE ENEM: 1.0000 | ENEMA | Freq: Once | RECTAL | Status: AC | PRN

## 2013-08-02 MED ORDER — VENLAFAXINE HCL ER 37.5 MG PO CP24
37.5000 mg | ORAL_CAPSULE | Freq: Every day | ORAL | Status: DC
  Administered 2013-08-04 – 2013-08-06 (×3): 37.5 mg via ORAL
  Filled 2013-08-02 (×4): qty 1

## 2013-08-02 MED ORDER — VERAPAMIL HCL ER 120 MG PO TBCR
120.0000 mg | EXTENDED_RELEASE_TABLET | Freq: Every day | ORAL | Status: DC
  Administered 2013-08-02 – 2013-08-05 (×4): 120 mg via ORAL
  Filled 2013-08-02 (×5): qty 1

## 2013-08-02 NOTE — Progress Notes (Signed)
Patient with a temp of 101.7 oral. MD on call paged and made aware per order, and new orders received.

## 2013-08-02 NOTE — H&P (Signed)
Raymond [name] is an 47 y.o. male.    Chief Complaint:    Left hip hematoma  HPI: Raymond [name], 47 y.o. 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  He had a total hip arthroplasty, anterior approach on 07/28/2013.  Now presents to the ER with 48 hours of pain and erythema around the left hip. He states that he hasn't been able to walk on the left hip either. Will admit for hematoma of the left hip and treat with imperical antibiotics.  PCP:  Irena Reichmann, DO  PMH: Past Medical History  Diagnosis Date  . Hypertension   . Depression   . Kidney stones   . GERD (gastroesophageal reflux disease)   . Arthritis   . Rib fractures     left side 06/2013     PSH: Past Surgical History  Procedure Laterality Date  . Kidney stone removal     . Refractive surgery    . Right shoulder surgery     . Nasal sinus surgery    . Total hip arthroplasty Left 07/28/2013    Procedure: LEFT TOTAL HIP ARTHROPLASTY ANTERIOR APPROACH;  Surgeon: Shelda Pal, MD;  Location: [facility];  Service: Orthopedics;  Laterality: Left;  . Joint replacement      Social History:  reports that he has never smoked. His smokeless tobacco use includes Chew. He reports that  drinks alcohol. He reports that he does not use illicit drugs.  Allergies:  No Known Allergies  Medications: Current Facility-Administered Medications  Medication Dose Route Frequency Provider Last Rate Last Dose  . 0.9 %  sodium chloride infusion   Intravenous Continuous Genelle Gather Hillary Schwegler, PA-C      . alum & mag hydroxide-simeth (MAALOX/MYLANTA) 200-200-20 MG/5ML suspension 30 mL  30 mL Oral Q6H PRN Genelle Gather Fadil Macmaster, PA-C      . aspirin EC tablet 325 mg  325 mg Oral BID Genelle Gather Mainor Hellmann, PA-C      .  atorvastatin (LIPITOR) tablet 40 mg  40 mg Oral q morning - 10a Genelle Gather Nickia Boesen, PA-C      . bisacodyl (DULCOLAX) suppository 10 mg  10 mg Rectal Daily PRN Genelle Gather Bernetta Sutley, PA-C      . docusate sodium (COLACE) capsule 100 mg  100 mg Oral BID Genelle Gather Alcus Bradly, PA-C      . ferrous sulfate tablet 325 mg  325 mg Oral TID PC Genelle Gather Worthing, PA-C      . HYDROmorphone (DILAUDID) injection 0.5-1 mg  0.5-1 mg Intravenous Q2H PRN Genelle Gather Tex Conroy, PA-C      . lisinopril (PRINIVIL,ZESTRIL) tablet 40 mg  40 mg Oral q morning - 10a Genelle Gather Raini Tiley, PA-C      . methocarbamol (ROBAXIN) tablet 500 mg  500 mg Oral Q6H PRN Genelle Gather Breon Diss, PA-C      . ondansetron [facility]) tablet 4 mg  4 mg Oral Q6H PRN Genelle Gather Marq Rebello, PA-C       Or  . ondansetron [facility]) injection 4 mg  4 mg Intravenous Q6H PRN Genelle Gather Briane Birden, PA-C      . oxyCODONE (Oxy IR/ROXICODONE) immediate release tablet 5-15 mg  5-15 mg Oral Q4H PRN Genelle Gather Claramae Rigdon, PA-C      . pantoprazole (PROTONIX) EC tablet  80 mg  80 mg Oral Daily Genelle Gather Maryah Marinaro, PA-C      . polyethylene glycol (MIRALAX / GLYCOLAX) packet 17 g  17 g Oral BID Genelle Gather Aerial Dilley, PA-C      . sodium phosphate (FLEET) 7-19 GM/118ML enema 1 enema  1 enema Rectal Once PRN Genelle Gather Fatina Sprankle, PA-C      . venlafaxine XR (EFFEXOR-XR) 24 hr capsule 37.5 mg  37.5 mg Oral Daily Genelle Gather Georganna Maxson, PA-C      . verapamil (VERELAN) 24 hr capsule 100 mg  100 mg Oral QHS Genelle Gather Margaree Sandhu, PA-C      . zolpidem (AMBIEN) tablet 5 mg  5 mg Oral QHS PRN,MR X 1 Gerrit Halls, [place]       Current Outpatient Prescriptions  Medication Sig Dispense Refill  . aspirin 325 MG tablet Take 325 mg by mouth 3 (three) times daily.      Marland Kitchen atorvastatin (LIPITOR) 40 MG tablet Take 40 mg by mouth every morning.      . docusate sodium 100 MG CAPS Take 100 mg by mouth 2 (two) times daily.  10 capsule  0  . ferrous sulfate 325 (65 FE) MG tablet Take 1  tablet (325 mg total) by mouth 3 (three) times daily after meals.    3  . lisinopril (PRINIVIL,ZESTRIL) 40 MG tablet Take 40 mg by mouth every morning.      . methocarbamol (ROBAXIN) 500 MG tablet Take 1 tablet (500 mg total) by mouth every 6 (six) hours as needed (muscle spasms).      Marland Kitchen omeprazole (PRILOSEC) 40 MG capsule Take 40 mg by mouth daily.      Marland Kitchen oxyCODONE (OXY IR/ROXICODONE) 5 MG immediate release tablet Take 1-3 tablets (5-15 mg total) by mouth every 4 (four) hours as needed for pain.  100 tablet  0  . polyethylene glycol (MIRALAX / GLYCOLAX) packet Take 17 g by mouth 2 (two) times daily.  14 each  0  . venlafaxine XR (EFFEXOR-XR) 37.5 MG 24 hr capsule Take 37.5 mg by mouth daily.      . verapamil (VERELAN) 100 MG 24 hr capsule Take 100 mg by mouth at bedtime.        Results for orders placed during the hospital encounter of 08/02/13 (from the past 48 hour(s))  CBC WITH DIFFERENTIAL     Status: Abnormal   Collection Time    08/02/13  8:24 AM      Result Value Range   WBC 11.8 (*) 4.0 - 10.5 K/uL   RBC 4.17 (*) 4.22 - 5.81 MIL/uL   Hemoglobin 11.2 (*) 13.0 - 17.0 g/dL   HCT 16.1 (*) 09.6 - 04.5 %   MCV 84.9  78.0 - 100.0 fL   MCH 26.9  26.0 - 34.0 pg   MCHC 31.6  30.0 - 36.0 g/dL   RDW 40.9 (*) 81.1 - 91.4 %   Platelets 190  150 - 400 K/uL   Neutrophils Relative % 70  43 - 77 %   Neutro Abs 8.3 (*) 1.7 - 7.7 K/uL   Lymphocytes Relative 14  12 - 46 %   Lymphs Abs 1.7  0.7 - 4.0 K/uL   Monocytes Relative 15 (*) 3 - 12 %   Monocytes Absolute 1.8 (*) 0.1 - 1.0 K/uL   Eosinophils Relative 0  0 - 5 %   Eosinophils Absolute 0.0  0.0 - 0.7 K/uL   Basophils Relative 0  0 - 1 %  Basophils Absolute 0.0  0.0 - 0.1 K/uL  BASIC METABOLIC PANEL     Status: Abnormal   Collection Time    08/02/13  8:24 AM      Result Value Range   Sodium 130 (*) 135 - 145 mEq/L   Potassium 3.7  3.5 - 5.1 mEq/L   Chloride 95 (*) 96 - 112 mEq/L   CO2 25  19 - 32 mEq/L   Glucose, Bld 132 (*) 70 - 99  mg/dL   BUN 11  6 - 23 mg/dL   Creatinine, Ser 1.61  0.50 - 1.35 mg/dL   Calcium 9.2  8.4 - 09.6 mg/dL   GFR calc non Af Amer >90  >90 mL/min   GFR calc Af Amer >90  >90 mL/min   Comment: (NOTE)     The eGFR has been calculated using the CKD EPI equation.     This calculation has not been validated in all clinical situations.     eGFR's persistently <90 mL/min signify possible Chronic Kidney     Disease.   Dg Hip Complete Left  08/02/2013   *RADIOLOGY REPORT*  Clinical Data: Left hip pain  LEFT HIP - COMPLETE 2+ VIEW  Comparison: 07/28/2013  Findings: Three views of the left hip submitted.  There is left hip prosthesis  in anatomic alignment.  The prosthesis material appears intact. No acute fracture or subluxation.  IMPRESSION: Left hip prosthesis in  anatomic alignment.   Original Report Authenticated By: Natasha Mead, M.D.    Review of Systems  Constitutional: Positive for fever.  HENT: Negative.   Eyes: Negative.   Respiratory: Negative.   Cardiovascular: Negative.   Gastrointestinal: Positive for heartburn.  Genitourinary: Negative.   Musculoskeletal: Positive for back pain and joint pain.  Neurological: Negative.   Endo/Heme/Allergies: Negative.   Psychiatric/Behavioral: Negative.      Physical Exam  Constitutional: He is oriented to person, place, and time and well-developed, well-nourished, and in no distress.  HENT:  Head: Normocephalic and atraumatic.  Eyes: Pupils are equal, round, and reactive to light.  Neck: Neck supple. No JVD present. No tracheal deviation present. No thyromegaly present.  Cardiovascular: Normal rate, regular rhythm, normal heart sounds and intact distal pulses.   Pulmonary/Chest: Effort normal and breath sounds normal. No stridor. No respiratory distress. He has no wheezes.  Abdominal: Soft. There is no tenderness. There is no guarding.  Musculoskeletal:       Left hip: He exhibits decreased range of motion, decreased strength, tenderness, bony  tenderness, swelling and laceration. He exhibits no crepitus.  Lymphadenopathy:    He has no cervical adenopathy.  Neurological: He is alert and oriented to person, place, and time.  Skin: Skin is warm and dry.  Psychiatric: Affect normal.    Assessment/Plan Assessment:    Left hip hematoma  Plan: Patient will be admitted to the hospital for his left hip hematoma per Dr. Charlann Boxer at [facility].  Will treat with imperical antibiotics and treat for pain management.   Anastasio Auerbach Willman Cuny   PAC  08/02/2013, 11:25 AM

## 2013-08-02 NOTE — ED Provider Notes (Signed)
CSN: [id number]     Arrival date & time 08/02/13  9528 History     First MD Initiated Contact with Patient 08/02/13 0756     Chief Complaint  Patient presents with  . Post-op Problem    Hip Replacement last week   (Consider location/radiation/quality/duration/timing/severity/associated sxs/prior Treatment) The history is provided by the patient.  Raymond [name] is a 47 y.o. male history of hypertension, arthritis with left hip replacement 5 days ago here with left hip pain. He has physical therapy came to his house 3 days ago. During this therapy session he said he may have felt a pop and afterwards he has severe left hip pain. He been taking his oxycodone with no relief. Denies falls or injuries. Also had fever 101F yesterday.    Past Medical History  Diagnosis Date  . Hypertension   . Depression   . Kidney stones   . GERD (gastroesophageal reflux disease)   . Arthritis   . Rib fractures     left side 06/2013    Past Surgical History  Procedure Laterality Date  . Kidney stone removal     . Refractive surgery    . Right shoulder surgery     . Nasal sinus surgery    . Total hip arthroplasty Left 07/28/2013    Procedure: LEFT TOTAL HIP ARTHROPLASTY ANTERIOR APPROACH;  Surgeon: Shelda Pal, MD;  Location: [facility];  Service: Orthopedics;  Laterality: Left;  . Joint replacement     History reviewed. No pertinent family history. History  Substance Use Topics  . Smoking status: Never Smoker   . Smokeless tobacco: Current User    Types: Chew  . Alcohol Use: Yes     Comment: occASional beer     Review of Systems  Musculoskeletal:       L hip pain   All other systems reviewed and are negative.    Allergies  Review of patient's allergies indicates no known allergies.  Home Medications   Current Outpatient Rx  Name  Route  Sig  Dispense  Refill  . aspirin 325 MG tablet   Oral   Take 325 mg by mouth 3 (three) times daily.         Marland Kitchen atorvastatin (LIPITOR) 40 MG  tablet   Oral   Take 40 mg by mouth every morning.         . docusate sodium 100 MG CAPS   Oral   Take 100 mg by mouth 2 (two) times daily.   10 capsule   0   . ferrous sulfate 325 (65 FE) MG tablet   Oral   Take 1 tablet (325 mg total) by mouth 3 (three) times daily after meals.      3   . lisinopril (PRINIVIL,ZESTRIL) 40 MG tablet   Oral   Take 40 mg by mouth every morning.         . methocarbamol (ROBAXIN) 500 MG tablet   Oral   Take 1 tablet (500 mg total) by mouth every 6 (six) hours as needed (muscle spasms).         Marland Kitchen omeprazole (PRILOSEC) 40 MG capsule   Oral   Take 40 mg by mouth daily.         Marland Kitchen oxyCODONE (OXY IR/ROXICODONE) 5 MG immediate release tablet   Oral   Take 1-3 tablets (5-15 mg total) by mouth every 4 (four) hours as needed for pain.   100 tablet   0   .  polyethylene glycol (MIRALAX / GLYCOLAX) packet   Oral   Take 17 g by mouth 2 (two) times daily.   14 each   0   . venlafaxine XR (EFFEXOR-XR) 37.5 MG 24 hr capsule   Oral   Take 37.5 mg by mouth daily.         . verapamil (VERELAN) 100 MG 24 hr capsule   Oral   Take 100 mg by mouth at bedtime.          BP 109/73  Pulse 109  Temp(Src) 98.8 F (37.1 C) (Oral)  Resp 16  Wt 196 lb (88.905 kg)  BMI 26.58 kg/m2  SpO2 96% Physical Exam  Nursing note and vitals reviewed. Constitutional: He is oriented to person, place, and time.  Uncomfortable   HENT:  Head: Normocephalic.  Mouth/Throat: Oropharynx is clear and moist.  Eyes: Conjunctivae are normal. Pupils are equal, round, and reactive to light.  Neck: Normal range of motion. Neck supple.  Cardiovascular: Normal rate, regular rhythm and normal heart sounds.   Pulmonary/Chest: Effort normal and breath sounds normal. No respiratory distress. He has no wheezes. He has no rales.  Abdominal: Soft. Bowel sounds are normal. He exhibits no distension. There is no tenderness. There is no rebound and no guarding.  Musculoskeletal:   L hip slightly flexed and shortened. Unable to range the hip and unable to lift it off the bed. Surgical site healing well but there is cellulitis involving the scars that extend to the upper part of his thigh. R hip nl ROM and nl neuro exam. 2+ pulses bilaterally, nl sensation   Neurological: He is alert and oriented to person, place, and time.  Skin: Skin is warm and dry.  Psychiatric: He has a normal mood and affect. His behavior is normal. Judgment and thought content normal.    ED Course   Procedures (including critical care time)  Labs Reviewed  CBC WITH DIFFERENTIAL - Abnormal; Notable for the following:    WBC 11.8 (*)    RBC 4.17 (*)    Hemoglobin 11.2 (*)    HCT 35.4 (*)    RDW 16.7 (*)    Neutro Abs 8.3 (*)    Monocytes Relative 15 (*)    Monocytes Absolute 1.8 (*)    All other components within normal limits  BASIC METABOLIC PANEL - Abnormal; Notable for the following:    Sodium 130 (*)    Chloride 95 (*)    Glucose, Bld 132 (*)    All other components within normal limits   Dg Hip Complete Left  08/02/2013   *RADIOLOGY REPORT*  Clinical Data: Left hip pain  LEFT HIP - COMPLETE 2+ VIEW  Comparison: 07/28/2013  Findings: Three views of the left hip submitted.  There is left hip prosthesis  in anatomic alignment.  The prosthesis material appears intact. No acute fracture or subluxation.  IMPRESSION: Left hip prosthesis in  anatomic alignment.   Original Report Authenticated By: Natasha Mead, M.D.   No diagnosis found.  MDM  Raymond [name] is a 47 y.o. male here with L hip pain, fever. Will need to r/o hip dislocation. I am also concerned about his fever and cellulitis. Will get xray and give pain meds. Will need abx and will contact Dr. Charlann Boxer.   9 AM I called Dr. Rennis Chris, who is covering Dr. Charlann Boxer. He will see patient. Asked me to hold off abx for now.   11:30 AM  Ortho saw patient, will admit for  IV abx.   Richardean Canal, MD 08/02/13 1131

## 2013-08-02 NOTE — ED Notes (Signed)
Incision to left hip evaluated by MD Silverio Lay. Incision cleaned and dressing changed

## 2013-08-02 NOTE — ED Notes (Signed)
Left hip with dressing, no bloody drainage noted. However, skin is red and blanchable and extends significantly past the perimeter of  Of dressing.

## 2013-08-02 NOTE — ED Notes (Signed)
Pt alert, arrives from home, c/o left hip post op complications, fever, pain, resp even unlabored, skin pwd

## 2013-08-03 ENCOUNTER — Inpatient Hospital Stay (HOSPITAL_COMMUNITY): Payer: BC Managed Care – PPO | Admitting: Anesthesiology

## 2013-08-03 ENCOUNTER — Encounter (HOSPITAL_COMMUNITY)
Admission: EM | Disposition: A | Discharge status: Home Health | Payer: Self-pay | Source: Home / Self Care | Attending: Orthopedic Surgery

## 2013-08-03 ENCOUNTER — Encounter (HOSPITAL_COMMUNITY): Payer: Self-pay | Admitting: Anesthesiology

## 2013-08-03 DIAGNOSIS — IMO0002 Reserved for concepts with insufficient information to code with codable children: Secondary | ICD-10-CM | POA: Diagnosis present

## 2013-08-03 HISTORY — PX: HEMATOMA EVACUATION: SHX5118

## 2013-08-03 HISTORY — PX: INCISION AND DRAINAGE ABSCESS: SHX5864

## 2013-08-03 LAB — GRAM STAIN: Gram Stain: NONE SEEN

## 2013-08-03 LAB — CBC
HCT: 32.3 % — ABNORMAL LOW (ref 39.0–52.0)
Hemoglobin: 10.1 g/dL — ABNORMAL LOW (ref 13.0–17.0)
MCH: 26.8 pg (ref 26.0–34.0)
MCHC: 31.3 g/dL (ref 30.0–36.0)
RDW: 17 % — ABNORMAL HIGH (ref 11.5–15.5)

## 2013-08-03 SURGERY — EVACUATION HEMATOMA
Anesthesia: General | Site: Hip | Laterality: Left | Wound class: Dirty or Infected

## 2013-08-03 MED ORDER — FENTANYL CITRATE 0.05 MG/ML IJ SOLN
25.0000 ug | INTRAMUSCULAR | Status: DC | PRN
  Administered 2013-08-03 (×3): 50 ug via INTRAVENOUS

## 2013-08-03 MED ORDER — ACETAMINOPHEN 10 MG/ML IV SOLN
INTRAVENOUS | Status: DC | PRN
  Administered 2013-08-03: 1000 mg via INTRAVENOUS

## 2013-08-03 MED ORDER — PROPOFOL 10 MG/ML IV BOLUS
INTRAVENOUS | Status: DC | PRN
  Administered 2013-08-03: 200 mg via INTRAVENOUS

## 2013-08-03 MED ORDER — ACETAMINOPHEN 10 MG/ML IV SOLN
1000.0000 mg | Freq: Four times a day (QID) | INTRAVENOUS | Status: DC
  Filled 2013-08-03: qty 100

## 2013-08-03 MED ORDER — CISATRACURIUM BESYLATE (PF) 10 MG/5ML IV SOLN
INTRAVENOUS | Status: DC | PRN
  Administered 2013-08-03: 5 mg via INTRAVENOUS

## 2013-08-03 MED ORDER — HYDROMORPHONE HCL PF 1 MG/ML IJ SOLN
0.5000 mg | INTRAMUSCULAR | Status: DC | PRN
  Administered 2013-08-03 – 2013-08-05 (×10): 1 mg via INTRAVENOUS
  Administered 2013-08-06 (×2): 2 mg via INTRAVENOUS
  Filled 2013-08-03 (×2): qty 1
  Filled 2013-08-03: qty 2
  Filled 2013-08-03 (×8): qty 1
  Filled 2013-08-03: qty 2

## 2013-08-03 MED ORDER — CEFAZOLIN SODIUM 1-5 GM-% IV SOLN
INTRAVENOUS | Status: DC | PRN
  Administered 2013-08-03: 1 g via INTRAVENOUS

## 2013-08-03 MED ORDER — GLYCOPYRROLATE 0.2 MG/ML IJ SOLN
INTRAMUSCULAR | Status: DC | PRN
  Administered 2013-08-03: 0.4 mg via INTRAVENOUS

## 2013-08-03 MED ORDER — HYDROMORPHONE HCL PF 1 MG/ML IJ SOLN
0.2500 mg | INTRAMUSCULAR | Status: DC | PRN
  Administered 2013-08-03 (×4): 0.5 mg via INTRAVENOUS

## 2013-08-03 MED ORDER — ACETAMINOPHEN 10 MG/ML IV SOLN: 1000.0000 mg | Freq: Four times a day (QID) | INTRAVENOUS | Status: DC

## 2013-08-03 MED ORDER — CEFAZOLIN SODIUM-DEXTROSE 2-3 GM-% IV SOLR
2.0000 g | Freq: Once | INTRAVENOUS | Status: DC
  Filled 2013-08-03: qty 50

## 2013-08-03 MED ORDER — HYDROMORPHONE HCL PF 1 MG/ML IJ SOLN
INTRAMUSCULAR | Status: DC | PRN
  Administered 2013-08-03: 1 mg via INTRAVENOUS
  Administered 2013-08-03: 0.5 mg via INTRAVENOUS

## 2013-08-03 MED ORDER — NEOSTIGMINE METHYLSULFATE 1 MG/ML IJ SOLN
INTRAMUSCULAR | Status: DC | PRN
  Administered 2013-08-03: 3 mg via INTRAVENOUS

## 2013-08-03 MED ORDER — LIDOCAINE HCL (CARDIAC) 20 MG/ML IV SOLN
INTRAVENOUS | Status: DC | PRN
  Administered 2013-08-03: 30 mg via INTRAVENOUS

## 2013-08-03 MED ORDER — SODIUM CHLORIDE 0.9 % IR SOLN
Status: DC | PRN
  Administered 2013-08-03: 9000 mL

## 2013-08-03 MED ORDER — ASPIRIN EC 325 MG PO TBEC
325.0000 mg | DELAYED_RELEASE_TABLET | Freq: Two times a day (BID) | ORAL | Status: DC
  Administered 2013-08-04 – 2013-08-06 (×5): 325 mg via ORAL
  Filled 2013-08-03 (×7): qty 1

## 2013-08-03 MED ORDER — MIDAZOLAM HCL 5 MG/5ML IJ SOLN
INTRAMUSCULAR | Status: DC | PRN
  Administered 2013-08-03: 1 mg via INTRAVENOUS

## 2013-08-03 MED ORDER — LACTATED RINGERS IV SOLN
INTRAVENOUS | Status: DC | PRN
  Administered 2013-08-03 (×2): via INTRAVENOUS

## 2013-08-03 MED ORDER — LACTATED RINGERS IV SOLN
INTRAVENOUS | Status: DC
Start: 2013-08-03 — End: 2013-08-03

## 2013-08-03 MED ORDER — LACTATED RINGERS IV SOLN
INTRAVENOUS | Status: DC
  Administered 2013-08-03: 1000 mL via INTRAVENOUS

## 2013-08-03 MED ORDER — FENTANYL CITRATE 0.05 MG/ML IJ SOLN
INTRAMUSCULAR | Status: DC | PRN
  Administered 2013-08-03 (×2): 25 ug via INTRAVENOUS
  Administered 2013-08-03 (×2): 50 ug via INTRAVENOUS
  Administered 2013-08-03 (×2): 25 ug via INTRAVENOUS
  Administered 2013-08-03: 50 ug via INTRAVENOUS

## 2013-08-03 MED ORDER — SUCCINYLCHOLINE CHLORIDE 20 MG/ML IJ SOLN
INTRAMUSCULAR | Status: DC | PRN
  Administered 2013-08-03: 160 mg via INTRAVENOUS

## 2013-08-03 MED ORDER — KETAMINE HCL 10 MG/ML IJ SOLN
INTRAMUSCULAR | Status: DC | PRN
  Administered 2013-08-03: 25 mg via INTRAVENOUS

## 2013-08-03 MED ORDER — ONDANSETRON HCL 4 MG/2ML IJ SOLN
INTRAMUSCULAR | Status: DC | PRN
  Administered 2013-08-03: 2 mg via INTRAVENOUS

## 2013-08-03 SURGICAL SUPPLY — 42 items
BAG ZIPLOCK 12X15 (MISCELLANEOUS) ×4 IMPLANT
BLADE SAW SGTL 18X1.27X75 (BLADE) IMPLANT
CLOTH BEACON ORANGE TIMEOUT ST (SAFETY) ×2 IMPLANT
DERMABOND ADVANCED (GAUZE/BANDAGES/DRESSINGS) ×1
DERMABOND ADVANCED .7 DNX12 (GAUZE/BANDAGES/DRESSINGS) ×1 IMPLANT
DRAPE C-ARM 42X120 X-RAY (DRAPES) IMPLANT
DRAPE STERI IOBAN 125X83 (DRAPES) ×2 IMPLANT
DRAPE U-SHAPE 47X51 STRL (DRAPES) ×6 IMPLANT
DRSG AQUACEL AG ADV 3.5X10 (GAUZE/BANDAGES/DRESSINGS) ×2 IMPLANT
DRSG TEGADERM 4X4.75 (GAUZE/BANDAGES/DRESSINGS) ×2 IMPLANT
DURAPREP 26ML APPLICATOR (WOUND CARE) ×2 IMPLANT
ELECT BLADE TIP CTD 4 INCH (ELECTRODE) ×4 IMPLANT
ELECT REM PT RETURN 9FT ADLT (ELECTROSURGICAL) ×2
ELECTRODE REM PT RTRN 9FT ADLT (ELECTROSURGICAL) ×1 IMPLANT
EVACUATOR 1/8 PVC DRAIN (DRAIN) ×2 IMPLANT
FACESHIELD LNG OPTICON STERILE (SAFETY) ×4 IMPLANT
GAUZE SPONGE 2X2 8PLY STRL LF (GAUZE/BANDAGES/DRESSINGS) ×1 IMPLANT
GLOVE BIOGEL PI IND STRL 7.5 (GLOVE) ×2 IMPLANT
GLOVE BIOGEL PI IND STRL 8 (GLOVE) ×1 IMPLANT
GLOVE BIOGEL PI INDICATOR 7.5 (GLOVE) ×2
GLOVE BIOGEL PI INDICATOR 8 (GLOVE) ×1
GLOVE ECLIPSE 8.0 STRL XLNG CF (GLOVE) ×2 IMPLANT
GLOVE ORTHO TXT STRL SZ7.5 (GLOVE) ×6 IMPLANT
GOWN BRE IMP PREV XXLGXLNG (GOWN DISPOSABLE) ×2 IMPLANT
GOWN STRL NON-REIN LRG LVL3 (GOWN DISPOSABLE) ×2 IMPLANT
KIT BASIN OR (CUSTOM PROCEDURE TRAY) ×2 IMPLANT
PACK TOTAL JOINT (CUSTOM PROCEDURE TRAY) ×2 IMPLANT
PADDING CAST COTTON 6X4 STRL (CAST SUPPLIES) ×2 IMPLANT
PENCIL BUTTON HOLSTER BLD 10FT (ELECTRODE) ×2 IMPLANT
SPONGE GAUZE 2X2 STER 10/PKG (GAUZE/BANDAGES/DRESSINGS) ×1
SUCTION FRAZIER 12FR DISP (SUCTIONS) IMPLANT
SUT MNCRL AB 3-0 PS2 18 (SUTURE) ×2 IMPLANT
SUT MNCRL AB 4-0 PS2 18 (SUTURE) ×2 IMPLANT
SUT PDS AB 0 CT 36 (SUTURE) ×2 IMPLANT
SUT PDS AB 1 CT1 27 (SUTURE) ×2 IMPLANT
SUT VIC AB 1 CT1 36 (SUTURE) IMPLANT
SUT VIC AB 2-0 CT1 27 (SUTURE) ×2
SUT VIC AB 2-0 CT1 TAPERPNT 27 (SUTURE) ×2 IMPLANT
SUT VLOC 180 0 24IN GS25 (SUTURE) ×2 IMPLANT
TOWEL OR 17X26 10 PK STRL BLUE (TOWEL DISPOSABLE) ×2 IMPLANT
TRAY FOLEY CATH 14FRSI W/METER (CATHETERS) IMPLANT
YANKAUER SUCT BULB TIP NO VENT (SUCTIONS) ×2 IMPLANT

## 2013-08-03 NOTE — Anesthesia Preprocedure Evaluation (Addendum)
Anesthesia Evaluation  Patient identified by MRN, date of birth, ID band Patient awake    Reviewed: Allergy & Precautions, H&P , NPO status , Patient's Chart, lab work & pertinent test results  Airway Mallampati: II TM Distance: >3 FB Neck ROM: full    Dental  (+) Edentulous Upper, Edentulous Lower and Dental Advisory Given   Pulmonary neg pulmonary ROS,  breath sounds clear to auscultation  Pulmonary exam normal       Cardiovascular Exercise Tolerance: Good hypertension, Pt. on medications Rhythm:regular Rate:Normal     Neuro/Psych Depression negative neurological ROS  negative psych ROS   GI/Hepatic negative GI ROS, Neg liver ROS, GERD-  Medicated and Controlled,  Endo/Other  negative endocrine ROS  Renal/GU negative Renal ROS  negative genitourinary   Musculoskeletal   Abdominal   Peds  Hematology negative hematology ROS (+) Blood dyscrasia, anemia , HGB 10.1   Anesthesia Other Findings   Reproductive/Obstetrics negative OB ROS                          Anesthesia Physical Anesthesia Plan  ASA: II  Anesthesia Plan: General   Post-op Pain Management:    Induction: Intravenous  Airway Management Planned: Oral ETT  Additional Equipment:   Intra-op Plan:   Post-operative Plan: Extubation in OR  Informed Consent: I have reviewed the patients History and Physical, chart, labs and discussed the procedure including the risks, benefits and alternatives for the proposed anesthesia with the patient or authorized representative who has indicated his/her understanding and acceptance.   Dental Advisory Given  Plan Discussed with: CRNA and Surgeon  Anesthesia Plan Comments:         Anesthesia Quick Evaluation

## 2013-08-03 NOTE — Brief Op Note (Signed)
08/02/2013 - 08/03/2013  6:54 PM  PATIENT:  Raymond [name]  47 y.o. male  PRE-OPERATIVE DIAGNOSIS:  hematoma left hip  POST-OPERATIVE DIAGNOSIS:  Infected wound hematoma left hip  PROCEDURE:  Procedure(s): EVACUATION HEMATOMA (Left) Excisional and non-excisional debridement of left hip wound  SURGEON:  Surgeon(s) and Role:    * Shelda Pal, MD - Primary  PHYSICIAN ASSISTANT: None  ANESTHESIA:   general  EBL:  Total I/O In: 0  Out: 500 [Urine:300; Blood:200]  BLOOD ADMINISTERED:none  DRAINS: (1 medium) Hemovact drain(s) in the left hip with  Suction Open   LOCAL MEDICATIONS USED:  NONE  SPECIMEN:  Source of Specimen:  lef thip wound  DISPOSITION OF SPECIMEN:  PATHOLOGY  COUNTS:  YES  TOURNIQUET:  * No tourniquets in log *  DICTATION: .Note written in EPIC and Other Dictation: Dictation Number [id number]  PLAN OF CARE: Admit to inpatient   PATIENT DISPOSITION:  PACU - hemodynamically stable.   Delay start of Pharmacological VTE agent (>24hrs) due to surgical blood loss or risk of bleeding: no

## 2013-08-03 NOTE — Transfer of Care (Signed)
Immediate Anesthesia Transfer of Care Note  Patient: Raymond [name]  Procedure(s) Performed: Procedure(s): EVACUATION HEMATOMA (Left) INCISION AND DRAINAGE ABSCESS (Left)  Patient Location: PACU  Anesthesia Type:General  Level of Consciousness: awake, alert , oriented and patient cooperative  Airway & Oxygen Therapy: Patient Spontanous Breathing and Patient connected to face mask oxygen  Post-op Assessment: Report given to PACU RN and Post -op Vital signs reviewed and stable  Post vital signs: stable  Complications: No apparent anesthesia complications

## 2013-08-03 NOTE — Anesthesia Postprocedure Evaluation (Signed)
  Anesthesia Post-op Note  Patient: Raymond [name]  Procedure(s) Performed: Procedure(s) (LRB): EVACUATION HEMATOMA (Left) INCISION AND DRAINAGE ABSCESS (Left)  Patient Location: PACU  Anesthesia Type: General  Level of Consciousness: awake and alert   Airway and Oxygen Therapy: Patient Spontanous Breathing  Post-op Pain: mild  Post-op Assessment: Post-op Vital signs reviewed, Patient's Cardiovascular Status Stable, Respiratory Function Stable, Patent Airway and No signs of Nausea or vomiting  Last Vitals:  Filed Vitals:   08/03/13 1915  BP: 111/89  Pulse: 81  Temp:   Resp: 20    Post-op Vital Signs: stable   Complications: No apparent anesthesia complications

## 2013-08-03 NOTE — Progress Notes (Signed)
Had a chance to review and examine Raymond [name] Increased pain and swelling right thigh with a reactive erythematous surrounding tissue  Tender to touch  Left thigh post operative hematoma at wound site  Plan: I reviewed with him that I feel the best treatment would be to evacuate the thigh hematoma He is only a week out from surgery We will monitor his progress post operatively He is ready to proceed  NPO Consent on chart

## 2013-08-04 ENCOUNTER — Encounter (HOSPITAL_COMMUNITY): Payer: Self-pay | Admitting: Orthopedic Surgery

## 2013-08-04 DIAGNOSIS — R509 Fever, unspecified: Secondary | ICD-10-CM

## 2013-08-04 DIAGNOSIS — Z96649 Presence of unspecified artificial hip joint: Secondary | ICD-10-CM

## 2013-08-04 DIAGNOSIS — T8489XA Other specified complication of internal orthopedic prosthetic devices, implants and grafts, initial encounter: Secondary | ICD-10-CM

## 2013-08-04 MED ORDER — SODIUM CHLORIDE 0.9 % IJ SOLN: 10.0000 mL | INTRAMUSCULAR | Status: DC | PRN

## 2013-08-04 NOTE — Op Note (Signed)
NAME:  Raymond [name], Raymond [name]                 ACCOUNT NO.:  [id number]  MEDICAL RECORD NO.:  [id number]  LOCATION:  1311                         FACILITY:  [facility]  PHYSICIAN:  Madlyn Frankel. Charlann Boxer, M.D.  DATE OF BIRTH:  07-23-1966  DATE OF PROCEDURE:  08/03/2013 DATE OF DISCHARGE:                              OPERATIVE REPORT   PREOPERATIVE DIAGNOSIS:  Left hip hematoma with associated cellulitis.  POSTOPERATIVE DIAGNOSIS/FINDINGS:  Infected superficial wound hematoma with concern for deep penetration.  PROCEDURE: 1. Evacuation of left hip hematoma. 2. Excisional and nonexcisional debridement of the left hip wound     including skin, removal of old sutures, subcutaneous tissue, as     well as some capsular tissue around the hip.  Nonexcisional     debridement was carried out with 9 L normal saline solution     including the 1 L of saline mixed with Betadine.  SURGEON:  Madlyn Frankel. Charlann Boxer, M.D.  ASSISTANT:  Surgical team.  ANESTHESIA:  General.  SPECIMENS:  Fluid from the left hip hematoma identified upon entry into the skin and was sent to pathology.  FINDINGS:  Please see body.  BLOOD LOSS:  Probably less than 100 mL.  INDICATIONS FOR PROCEDURE:  Mr. Binette is a 47 year old gentleman who underwent a left total hip replacement of anterior approach, just a week ago.  He had initially been doing well, but presented to the emergency room over the weekend with increasing pain and swelling in his left hip. He was seen by one of my partners, Dr. Francena Hanly, who was kind enough to admit him.  He has seen and evaluated this morning.  He had reported fevers over 101.  He had noted to be in increased pain over the past 2-3 days.  Admission warranted for evaluation of his hip wound under surgical conditions with plan for irrigation as well.  The risks of infection in the early stages of dealing 2 separate procedures, risks of DVT, component failure, need for future revision surgery were  all discussed.  Consent was obtained for benefit of pain relief in management of the current situation.  PROCEDURE IN DETAIL:  The patient was brought to operative theater. Once adequate anesthesia, preoperative antibiotics administered.  The patient had been getting Ancef already from my partner who admitted him due to concerns for some cellulitis on his leg or his thigh area.  In addition, he got a g of Ancef preoperatively.  The patient was positioned supine on the Hana table with his feet into the boots.  Bony prominences were padded, including a perineal post.  The time-out was performed identifying the patient, planned procedure, and extremity. Following a predraping, we then prepped and draped the left hip in a sterile fashion with an appropriate 3 minute time.  I have allowed for the DuraPrep to dry.  The skin incision was marked prior to final draping.  The skin incision was made upon entry into the skin and subcutaneous tissue encountered as hematoma that had a purulent appearance to it.  Cultures were taken.  I subsequently irrigated this layer out with 2 L normal saline solution. Based on this finding, I felt that it  was necessary and appropriate to explore deep.  I removed the fascial closer which was a 0 V-Loc suture. Upon entry into the joint space, I found more hematoma less obvious purulence.  Nonetheless, I treated this as penetration into the deep tissue.  I subsequently irrigated the wound with the remaining L of normal saline solution.  I then excised sharply with the Bovie on the cut mode of some of the capsule and any of the tissue.  I then irrigated the hip again with 3 L of normal saline solution deep.  Following this, I irrigated the hip with 1 L of normal saline mixed with a bottle of Betadine.  I then re-irrigated the hip after a few minutes with 3 L of normal saline solution.  At this point, I placed a medium Hemovac drain deep, reapproximated the fascia  of the tensor fascia lata muscle with a #1 PDS suture, and then the subcu layer with 2-0 Vicryl and a running 3-0 Monocryl.  The hip was then cleaned, dried, and dressed sterilely using a Dermabond and Aquacel dressing.  Drain site was dressed separately.  He was then extubated and brought to the recovery room in stable condition.  Based on these findings, I will treat him aggressively.  The overall appearance of his fluid appeared to be more of a strep appearance and staph.  We will get infectious disease consult.  We will get a PICC line below and be weightbearing as tolerated.  Our goal will be to salvage this joint to prevent any long-term complications, and findings will be reviewed with family.     Madlyn Frankel Charlann Boxer, M.D.     MDO/MEDQ  D:  08/03/2013  T:  08/04/2013  Job:  [id number]

## 2013-08-04 NOTE — Consult Note (Addendum)
[facility]     Reason for Consult: hematoma with pus    Referring Physician: Dr. Charlann Boxer  Principal Problem:   Hematoma - postoperative, initial encounter   . aspirin EC  325 mg Oral BID  . atorvastatin  40 mg Oral q morning - 10a  .  ceFAZolin (ANCEF) IV  2 g Intravenous Q8H  . docusate sodium  100 mg Oral BID  . ferrous sulfate  325 mg Oral TID PC  . lisinopril  40 mg Oral q morning - 10a  . pantoprazole  80 mg Oral Daily  . polyethylene glycol  17 g Oral BID  . venlafaxine XR  37.5 mg Oral Daily  . verapamil  120 mg Oral QHS    Recommendations: Continue cefazolin I would plan for 4 weeks with concern for deep penetration through September 14th, though may be able to switch to Keflex 500 mg qid after 2 weeks or so if doing well  Will continue to follow culture Home health for IV antibiotics per protocol Weekly cbc, cmp to [facility] ([phone]) We will arrange follow up in about 2 weeks (with me, overbook) Will check baseline ESR, CRP  Assessment: He had his hip replaced last week and developed fever to 101 at home several days prior to presentation and no other signs of infection concerning for hip infection.  Did have hematoma with some pus in joint space.  I think cefazolin will do well since he has noted improvement, no MRSA colonization, associated with hematoma ans with cellulitis, so not likely GNR and consistent with Staph or Strep.    Antibiotics: cefazolin  HPI: Raymond [name] is a 47 y.o. 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  He was having signficant pain and swelling.  He was then admitted and started on cefazolin empirically and went to the OR on 8/18 due to concern of deep infection.  Hematoma was found deep with some [place] amount of purulence.  After starting cefazolin, prior to surgery, the patient  noted significant improvement in his erythema and pain.  Cultures to date are negative and gram stain with a few WBCs and no organisms.     Review of Systems: A comprehensive review of systems was negative.  Past Medical History  Diagnosis Date  . Hypertension   . Depression   . Kidney stones   . GERD (gastroesophageal reflux disease)   . Arthritis   . Rib fractures     left side 06/2013     History  Substance Use Topics  . Smoking status: Never Smoker   . Smokeless tobacco: Current User    Types: Chew  . Alcohol Use: Yes     Comment: occASional beer     History reviewed. No pertinent family history. No Known Allergies  OBJECTIVE: Blood pressure 112/52, pulse 66, temperature 98.8 F (37.1 C), temperature source Oral, resp. rate 16, height 6' (1.829 m), weight 197 lb 5 oz (89.5 kg), SpO2 96.00%. General: Awake, alert, nad Skin: left thigh with minimal erythema, wrapped with drain at site of incision Lungs: CTA B Cor: RRR without m/r/g Abdomen: soft, nt, nd, +bs Ext: no edema lower ext  Microbiology: Recent Results (from the past 240 hour(s))  CULTURE, ROUTINE-ABSCESS     Status: None   Collection Time    08/03/13  6:14 PM      Result Value Range Status   Specimen Description HIP LEFT   Final   Special Requests ANCEF   Final   Gram Stain     Final   Value: FEW WBC PRESENT, PREDOMINANTLY PMN     NO SQUAMOUS EPITHELIAL CELLS SEEN     NO ORGANISMS SEEN     Performed by [facility]     Performed at [facility]   Culture     Final   Value: NO GROWTH 1 DAY     Performed at [facility]   Report Status PENDING   Incomplete  ANAEROBIC CULTURE     Status: None   Collection Time    08/03/13  6:14 PM      Result Value Range Status   Specimen Description HIP LEFT   Final   Special Requests ANCEF   Final   Gram Stain     Final   Value: FEW WBC PRESENT, PREDOMINANTLY PMN     NO SQUAMOUS EPITHELIAL CELLS SEEN     NO ORGANISMS SEEN      Performed by [facility]     Performed at [facility]   Culture     Final   Value: NO ANAEROBES ISOLATED; CULTURE IN PROGRESS FOR 5 DAYS     Performed at [facility]   Report Status PENDING   Incomplete  GRAM STAIN     Status: None   Collection Time    08/03/13  6:14 PM      Result Value Range Status   Specimen Description HIP LEFT   Final   Special Requests ANCEF   Final   Gram Stain     Final   Value: NO ORGANISMS SEEN     FEW WBC PRESENT, PREDOMINANTLY PMN     Gram Stain Report Called to,Read Back By and Verified With: NANNEY,M. AT 1927 ON [id number] BY LOVE,T.   Report Status 08/03/2013 FINAL   Final    Staci Righter, MD [facility] [email or web address] [phone] pager  [phone] cell 08/04/2013, 1:10 PM

## 2013-08-04 NOTE — Progress Notes (Signed)
   Subjective: 1 Day Post-Op Procedure(s) (LRB): EVACUATION HEMATOMA (Left) INCISION AND DRAINAGE ABSCESS (Left)   Patient reports pain as mild, pain well controlled. No events throughout the night. We have discussed the need for a PICC line and long term antibiotics. He states that he understands.  Objective:   VITALS:   Filed Vitals:   08/04/13 1415  BP: 120/61  Pulse: 76  Temp: 97.8 F (36.6 C)  Resp: 16    Neurovascular intact Dorsiflexion/Plantar flexion intact Incision: dressing C/D/I  LABS  Recent Labs  08/02/13 0824 08/03/13 0345  HGB 11.2* 10.1*  HCT 35.4* 32.3*  WBC 11.8* 9.7  PLT 190 194     Recent Labs  08/02/13 0824  NA 130*  K 3.7  BUN 11  CREATININE 0.80  GLUCOSE 132*     Assessment/Plan: 1 Day Post-Op Procedure(s) (LRB): EVACUATION HEMATOMA (Left) INCISION AND DRAINAGE ABSCESS (Left) HV drain left to continue draining Foley cath d/c'ed ID Consult called, Dr. Luciana Axe PICC line ordered Advance diet Up with therapy D/C IV fluids Discharge home with home health eventually when ready  Expected ABLA  Treated with iron and will observe  Overweight (BMI 25-29.9) Estimated body mass index is 26.75 kg/(m^2) as calculated from the following:   Height as of this encounter: 6' (1.829 m).   Weight as of this encounter: 89.5 kg (197 lb 5 oz). Patient also counseled that weight may inhibit the healing process Patient counseled that losing weight will help with future health issues      Anastasio Auerbach. Roberta Kelly   PAC  08/04/2013, 5:58 PM

## 2013-08-04 NOTE — Evaluation (Signed)
Physical Therapy Evaluation Patient Details Name: Zacchaeus Halm MRN: [id number] DOB: 1966-07-10 Today's Date: 08/04/2013 Time: 4098-1191 PT Time Calculation (min): 25 min  PT Assessment / Plan / Recommendation History of Present Illness  47 y.o. male who had L anterior approach THA 07/28/13 now admitted with fever, erythema and pain in R hip. Pt underwent evacuation of hematoma and debridement of L hip wound 08/03/13.  Clinical Impression  *Pt ambulated 58' with RW, distance limited by L hip pain. Performed L THA exercises with assist. L hip strength/ROM limited by pain. Expect good progress. Pt would benefit from acute PT to maximize safety and independence with mobility.**    PT Assessment  Patient needs continued PT services    Follow Up Recommendations  Home health PT    Does the patient have the potential to tolerate intense rehabilitation      Barriers to Discharge        Equipment Recommendations  Rolling walker with 5" wheels    Recommendations for Other Services     Frequency 7X/week    Precautions / Restrictions Precautions Precautions: Fall Restrictions Weight Bearing Restrictions: No LLE Weight Bearing: Weight bearing as tolerated   Pertinent Vitals/Pain *7/10 L hip with walking Premedicated, ice applied**      Mobility  Bed Mobility Bed Mobility: Supine to Sit Supine to Sit: 4: Min assist Details for Bed Mobility Assistance: min A for LLE Transfers Transfers: Sit to Stand;Stand to Sit Sit to Stand: 4: Min assist;From bed Stand to Sit: 4: Min guard;To chair/3-in-1 Details for Transfer Assistance: VCs hand placement Ambulation/Gait Ambulation/Gait Assistance: 4: Min guard;5: Supervision Ambulation Distance (Feet): 40 Feet Assistive device: Rolling walker Ambulation/Gait Assistance Details: advancing LLE painful, VCs to correct flexed neck posture Gait Pattern: Step-to pattern;Antalgic;Decreased weight shift to left Stairs: No    Exercises Total  Joint Exercises Ankle Circles/Pumps: AROM;10 reps;Supine;Both Quad Sets: AROM;Both;Supine;5 reps Heel Slides: AAROM;Supine;Left;10 reps Hip ABduction/ADduction: AAROM;Left;Supine;10 reps Long Arc Quad: AAROM;Left;10 reps   PT Diagnosis: Difficulty walking;Acute pain  PT Problem List: Decreased strength;Decreased range of motion;Decreased activity tolerance;Decreased mobility;Decreased knowledge of use of DME;Pain PT Treatment Interventions: DME instruction;Gait training;Stair training;Functional mobility training;Therapeutic activities;Therapeutic exercise;Patient/family education     PT Goals(Current goals can be found in the care plan section) Acute Rehab PT Goals Patient Stated Goal: Resume previous lifestyle with decreased pain PT Goal Formulation: With patient Time For Goal Achievement: 08/18/13 Potential to Achieve Goals: Good  Visit Information  Last PT Received On: 08/04/13 Assistance Needed: +1 History of Present Illness: 47 y.o. male who had L anterior approach THA 07/28/13 now admitted with fever, erythema and pain in R hip. Pt underwent evacuation of hematoma and debridement of L hip wound 08/03/13.       Prior Functioning  Home Living Family/patient expects to be discharged to:: Private residence Living Arrangements: Alone Available Help at Discharge: Family Type of Home: House Home Access: Stairs to enter Secretary/administrator of Steps: 4 Entrance Stairs-Rails: Right;Left;Can reach both Home Layout: One level Home Equipment: Crutches Additional Comments: Mother lives next door Prior Function Level of Independence: Independent;Independent with assistive device(s) Comments: hx of falls with hip giving way Communication Communication: No difficulties Dominant Hand: Right    Cognition  Cognition Arousal/Alertness: Awake/alert Behavior During Therapy: WFL for tasks assessed/performed Overall Cognitive Status: Within Functional Limits for tasks assessed     Extremity/Trunk Assessment Upper Extremity Assessment Upper Extremity Assessment: Overall WFL for tasks assessed Lower Extremity Assessment Lower Extremity Assessment: LLE deficits/detail LLE Deficits /  Details: hip 2/5 limited by pain, ABD to 15*, flexion to 40* -limited by pain Cervical / Trunk Assessment Cervical / Trunk Assessment: Normal   Balance    End of Session PT - End of Session Equipment Utilized During Treatment: Gait belt Activity Tolerance: Patient tolerated treatment well Patient left: with call bell/phone within reach;with family/visitor present;in chair Nurse Communication: Mobility status  GP     Ralene Bathe Kistler 08/04/2013, 1:45 PM [phone]

## 2013-08-04 NOTE — Progress Notes (Signed)
Peripherally Inserted Central Catheter/Midline Placement  The IV Nurse has discussed with the patient and/or persons authorized to consent for the patient, the purpose of this procedure and the potential benefits and risks involved with this procedure.  The benefits include less needle sticks, lab draws from the catheter and patient may be discharged home with the catheter.  Risks include, but not limited to, infection, bleeding, blood clot (thrombus formation), and puncture of an artery; nerve damage and irregular heat beat.  Alternatives to this procedure were also discussed.  PICC/Midline Placement Documentation        Timmothy Sours 08/04/2013, 3:55 PM

## 2013-08-05 NOTE — Plan of Care (Signed)
Problem: Phase I Progression Outcomes Goal: Hemodynamically stable Outcome: Progressing Low grade fevers         

## 2013-08-05 NOTE — Progress Notes (Signed)
Patient ID: Raymond [name], male   DOB: 09-21-1966, 47 y.o.   MRN: [id number] Subjective: 2 Days Post-Op Procedure(s) (LRB): EVACUATION HEMATOMA (Left) INCISION AND DRAINAGE ABSCESS (Left)    Patient reports pain as moderate.  Sleeping on left side  Objective:   VITALS:   Filed Vitals:   08/05/13 0500  BP: 118/71  Pulse: 80  Temp: 98.9 F (37.2 C)  Resp: 20    Neurovascular intact Incision: dressing C/D/I  LABS  Recent Labs  08/02/13 0824 08/03/13 0345  HGB 11.2* 10.1*  HCT 35.4* 32.3*  WBC 11.8* 9.7  PLT 190 194     Recent Labs  08/02/13 0824  NA 130*  K 3.7  BUN 11  CREATININE 0.80  GLUCOSE 132*    No results found for this basename: LABPT, INR,  in the last 72 hours   Assessment/Plan: 2 Days Post-Op Procedure(s) (LRB): EVACUATION HEMATOMA (Left) INCISION AND DRAINAGE ABSCESS (Left)   Up with therapy ID consulted  PICC line ordered  Plan to cure infection present at operation with long term IV then suppressive PO antibiotics  Up with therapy WBAT LLE  Drain removed today

## 2013-08-05 NOTE — Plan of Care (Signed)
Problem: Phase I Progression Outcomes Goal: OOB as tolerated unless otherwise ordered Outcome: Completed/Met Date Met:  08/05/13 Ambulated with PT on 8/19. OOB to chair on 8/19 am to bathe.

## 2013-08-05 NOTE — Plan of Care (Signed)
Problem: Phase I Progression Outcomes Goal: Pain controlled with appropriate interventions Outcome: Progressing Pain decreased to 3-4 after prns.

## 2013-08-05 NOTE — Plan of Care (Signed)
Problem: Phase II Progression Outcomes Goal: Return of bowel function (flatus, BM) IF ABDOMINAL SURGERY:  Outcome: Completed/Met Date Met:  08/05/13 Had Bowel movement on 8/19

## 2013-08-06 LAB — CBC
Hemoglobin: 9.6 g/dL — ABNORMAL LOW (ref 13.0–17.0)
MCH: 26.2 pg (ref 26.0–34.0)
MCHC: 31 g/dL (ref 30.0–36.0)
MCV: 84.5 fL (ref 78.0–100.0)
Platelets: 267 10*3/uL (ref 150–400)
RBC: 3.67 MIL/uL — ABNORMAL LOW (ref 4.22–5.81)

## 2013-08-06 MED ORDER — ASPIRIN 325 MG PO TBEC: 325.0000 mg | DELAYED_RELEASE_TABLET | Freq: Two times a day (BID) | ORAL | Status: AC

## 2013-08-06 MED ORDER — CEFAZOLIN SODIUM-DEXTROSE 2-3 GM-% IV SOLR: 2.0000 g | Freq: Three times a day (TID) | INTRAVENOUS | Status: AC

## 2013-08-06 MED ORDER — OXYCODONE HCL 5 MG PO TABS: 5.0000 mg | ORAL_TABLET | ORAL | Status: DC | PRN

## 2013-08-06 NOTE — Progress Notes (Signed)
Patient discharged home, all discharge medications and instructions reviewed and questions answered. Patient provided with prescription for pain medication and IV antibiotics.  Patient to be assisted to vehicle by wheelchair.

## 2013-08-06 NOTE — Progress Notes (Signed)
   Subjective: 3 Days Post-Op Procedure(s) (LRB): EVACUATION HEMATOMA (Left) INCISION AND DRAINAGE ABSCESS (Left)   Patient reports pain as mild, pain well controlled. No events throughout the night.   Objective:   VITALS:   Filed Vitals:   08/06/13 0615  BP: 125/74  Pulse: 75  Temp: 99 F (37.2 C)  Resp: 20    Neurovascular intact Dorsiflexion/Plantar flexion intact Incision: dressing C/D/I No cellulitis present Compartment soft  LABS  Recent Labs  08/06/13 0900  HGB 9.6*  HCT 31.0*  WBC 6.7  PLT 267     Assessment/Plan: 3 Days Post-Op Procedure(s) (LRB): EVACUATION HEMATOMA (Left) INCISION AND DRAINAGE ABSCESS (Left)   Up with therapy Discharge home with home health Follow up in 2 weeks at [facility]. Follow up with OLIN,Tikisha Molinaro D in 2 weeks.  Contact information:  [facility] [address] [place] [address] [phone]        Anastasio Auerbach. Jojo Geving   PAC  08/06/2013, 9:38 AM

## 2013-08-06 NOTE — Care Management Note (Signed)
   CARE MANAGEMENT NOTE 08/06/2013  Patient:  CORTLAN, DOLIN   Account Number:  [id number]  Date Initiated:  08/06/2013  Documentation initiated by:  Tuwanda Vokes  Subjective/Objective Assessment:   47 yo male admitted with left leg cellulitis. S/P  Days Post-Op Procedure(s) (LRB):  EVACUATION HEMATOMA (Left)  INCISION AND DRAINAGE ABSCESS (Left)     Action/Plan:   Home when stable   Anticipated DC Date:  08/06/2013   Anticipated DC Plan:  HOME W HOME HEALTH SERVICES      DC Planning Services  CM consult      Choice offered to / List presented to:  C-1 Patient   DME arranged  NA      DME agency  NA     HH arranged  HH-1 RN  [facility]  IV Antibiotics      HH agency  [facility]   Status of service:  Completed, signed off Medicare Important Message given?   (If response is "NO", the following Medicare IM given date fields will be blank) Date Medicare IM given:   Date Additional Medicare IM given:    Discharge Disposition:    Per UR Regulation:  Reviewed for med. necessity/level of care/duration of stay  If discussed at Long Length of Stay Meetings, dates discussed:    Comments:  08/06/13 Roland Earl [phone] Per pt choice [facility] to provide [facility] services upon discharge. Genevieve Norlander rep Venia Minks notified & rep spoke with pt at bedside. Pt's mother to provide assistance with home IV ABX. Per pt has DME for home use. No other barriers identified. Pt to discharge. Genevieve Norlander RN scheduled to administer 1600 IV ABX dose at home.

## 2013-08-06 NOTE — Care Management Note (Signed)
Per pt choice [facility] to provide [facility] services upon discharge. Genevieve Norlander rep Venia Minks notified & rep spoke with pt at bedside. Pt's mother to provide assistance with home IV ABX. Per pt has DME for home use. No other barriers identified.     Dierre Crevier,RN.MSN [phone]

## 2013-08-06 NOTE — Progress Notes (Signed)
Physical Therapy Treatment Patient Details Name: Raymond [name] MRN: [id number] DOB: [birth year] Today's Date: 08/06/2013 Time: 6063-0160 PT Time Calculation (min): 20 min  PT Assessment / Plan / Recommendation  History of Present Illness     PT Comments   Reports ant. Thigh L is sore.  Follow Up Recommendations  Home health PT     Does the patient have the potential to tolerate intense rehabilitation     Barriers to Discharge        Equipment Recommendations  None recommended by PT    Recommendations for Other Services    Frequency     Progress towards PT Goals Progress towards PT goals: Progressing toward goals  Plan Current plan remains appropriate;Equipment recommendations need to be updated    Precautions / Restrictions Precautions Precautions: Fall Restrictions Weight Bearing Restrictions: Yes LLE Weight Bearing: Weight bearing as tolerated   Pertinent Vitals/Pain Sore , premedicated.    Mobility  Bed Mobility Supine to Sit: 6: Modified independent (Device/Increase time) Transfers Sit to Stand: From bed;5: Supervision Stand to Sit: To chair/3-in-1;5: Supervision Ambulation/Gait Ambulation/Gait Assistance: 5: Supervision Ambulation Distance (Feet): 150 Feet Assistive device: Rolling walker Gait Pattern: Step-to pattern;Antalgic;Decreased weight shift to left General Gait Details: pt better able to advance LLE today Stairs: No (verbally reviewed with pt. He states he hasit down.)    Exercises     PT Diagnosis:    PT Problem List:   PT Treatment Interventions:     PT Goals (current goals can now be found in the care plan section)    Visit Information  Last PT Received On: 08/06/13 Assistance Needed: +1    Subjective Data      Cognition  Cognition Arousal/Alertness: Awake/alert    Balance     End of Session PT - End of Session Activity Tolerance: Patient tolerated treatment well Patient left: with family/visitor present;in chair Nurse  Communication: Mobility status   GP     Raymond [name] 08/06/2013, 11:31 AM

## 2013-08-07 LAB — CULTURE, ROUTINE-ABSCESS

## 2013-08-08 LAB — ANAEROBIC CULTURE

## 2013-08-11 NOTE — Discharge Summary (Signed)
Physician Discharge Summary  Patient ID: Raymond [name] MRN: [id number] DOB/AGE: 48/13/1967 48 y.o.  Admit date: 08/02/2013 Discharge date: 08/06/2013   Procedures:  Procedure(s) (LRB): EVACUATION HEMATOMA (Left) INCISION AND DRAINAGE ABSCESS (Left)  Attending Physician:  Dr. Durene Romans   Admission Diagnoses:   Left hip hematoma  Discharge Diagnoses:  Principal Problem:   Hematoma - postoperative, initial encounter Active Problems:   Overweight (BMI 25.0-29.9)   Expected blood loss anemia  Past Medical History  Diagnosis Date  . Hypertension   . Depression   . Kidney stones   . GERD (gastroesophageal reflux disease)   . Arthritis   . Rib fractures     left side 06/2013     HPI: Raymond [name], 47 y.o. 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 He had a total hip arthroplasty, anterior approach on 07/28/2013. Now presents to the ER with 48 hours of pain and erythema around the left hip. He states that he hasn't been able to walk on the left hip either. Will admit for hematoma of the left hip and treat with imperical antibiotics on 08/02/2013.  PCP: Irena Reichmann, DO   Discharged Condition: good  Hospital Course:  Patient underwent the above stated procedure on 08/03/2013. Patient tolerated the procedure well and brought to the recovery room in good condition and subsequently to the floor.  POD #1 BP: 120/61 ; Pulse: 76 ; Temp: 97.8 F (36.6 C) ; Resp: 16  Pt's foley was removed, as well as the hemovac drain removed. IV was changed to a saline lock. Patient reports pain as mild, pain well controlled. No events throughout the night. We have discussed the need for a PICC line and long term antibiotics. He states that he  understands. Neurovascular intact, dorsiflexion/Plantar flexion intact and incision: dressing C/D/I.  POD #2  BP: 118/71 ; Pulse: 80 ; Temp: 98.9 F (37.2 C) ; Resp: 20  Patient reports pain as moderate. Sleeping on left side.  Dr. Luciana Axe (Infectious disease) has seen and made recommendations. Neurovascular intact, dorsiflexion/Plantar flexion intact and incision: dressing C/D/I.  POD #3  BP: 125/74 ; Pulse: 75 ; Temp: 99 F (37.2 C) ; Resp: 20 Patient reports pain as mild, pain well controlled. No events throughout the night. Ready to be discharged home. Neurovascular intact, dorsiflexion/Plantar flexion intact and incision: dressing C/D/I.  LABS   HGB   9.6*   HCT   31.0*    Discharge Exam: General appearance: alert, cooperative and no distress Extremities: Homans sign is negative, no sign of DVT, no edema, redness or tenderness in the calves or thighs and no ulcers, gangrene or trophic changes  Disposition:    Home or Self Care with follow up in 2 weeks   Follow-up Information   Follow up with Shelda Pal, MD. Schedule an appointment as soon as possible for a visit in 2 weeks.   Specialty:  Orthopedic Surgery   Contact information:   [address] [place] [address] [phone]       Follow up with Staci Righter, MD. Schedule an appointment as soon as possible for a visit in 2 weeks.   Specialty:  Infectious Diseases   Contact information:   [address] [place] [address] [phone]  Discharge Orders   Future Appointments Provider Department Dept Phone   08/18/2013 8:45 AM Gardiner Barefoot, MD [facility] [phone]   Future Orders Complete By Expires   Call MD / Call 911  As directed    Comments:     If you experience chest pain or shortness of breath, CALL 911 and be transported to the hospital emergency room.  If you develope a fever above 101 F, pus (white drainage) or increased  drainage or redness at the wound, or calf pain, call your surgeon's office.   Change dressing  As directed    Comments:     Maintain surgical dressing for 10-14 days, then replace with 4x4 guaze and tape. Keep the area dry and clean.   Constipation Prevention  As directed    Comments:     Drink plenty of fluids.  Prune juice may be helpful.  You may use a stool softener, such as Colace (over the counter) 100 mg twice a day.  Use MiraLax (over the counter) for constipation as needed.   Diet - low sodium heart healthy  As directed    Discharge instructions  As directed    Comments:     Maintain surgical dressing for 10-14 days, then replace with gauze and tape. Keep the area dry and clean until follow up. Follow up in 2 weeks at [facility]. Call with any questions or concerns.   Increase activity slowly as tolerated  As directed    TED hose  As directed    Comments:     Use stockings (TED hose) for 2 weeks on both leg(s).  You may remove them at night for sleeping.   Weight bearing as tolerated  As directed         Medication List    STOP taking these medications       aspirin 325 MG tablet  Replaced by:  aspirin 325 MG EC tablet      TAKE these medications       aspirin 325 MG EC tablet  Take 1 tablet (325 mg total) by mouth 2 (two) times daily.     atorvastatin 40 MG tablet  Commonly known as:  LIPITOR  Take 40 mg by mouth every morning.     ceFAZolin 2-3 GM-% Solr  Commonly known as:  ANCEF  Inject 50 mLs (2 g total) into the vein every 8 (eight) hours.     DSS 100 MG Caps  Take 100 mg by mouth 2 (two) times daily.     ferrous sulfate 325 (65 FE) MG tablet  Take 1 tablet (325 mg total) by mouth 3 (three) times daily after meals.     lisinopril 40 MG tablet  Commonly known as:  PRINIVIL,ZESTRIL  Take 40 mg by mouth every morning.     methocarbamol 500 MG tablet  Commonly known as:  ROBAXIN  Take 1 tablet (500 mg total) by mouth every 6 (six) hours as  needed (muscle spasms).     omeprazole 40 MG capsule  Commonly known as:  PRILOSEC  Take 40 mg by mouth daily.     oxyCODONE 5 MG immediate release tablet  Commonly known as:  Oxy IR/ROXICODONE  Take 1-3 tablets (5-15 mg total) by mouth every 4 (four) hours as needed.     polyethylene glycol packet  Commonly known as:  MIRALAX / GLYCOLAX  Take 17 g by mouth 2 (two) times daily.  venlafaxine XR 37.5 MG 24 hr capsule  Commonly known as:  EFFEXOR-XR  Take 37.5 mg by mouth daily.     verapamil 100 MG 24 hr capsule  Commonly known as:  VERELAN  Take 100 mg by mouth at bedtime.         Signed: Anastasio Auerbach. Abshir Paolini   PAC  08/11/2013, 11:00 AM

## 2013-08-18 ENCOUNTER — Ambulatory Visit
Admission: RE | Admit: 2013-08-18 | Discharge: 2013-08-18 | Disposition: A | Discharge status: Home / Self Care | Payer: BC Managed Care – PPO | Source: Ambulatory Visit | Attending: Internal Medicine | Admitting: Internal Medicine

## 2013-08-18 ENCOUNTER — Encounter: Payer: Self-pay | Admitting: Internal Medicine

## 2013-08-18 ENCOUNTER — Ambulatory Visit (INDEPENDENT_AMBULATORY_CARE_PROVIDER_SITE_OTHER): Payer: BC Managed Care – PPO | Admitting: Internal Medicine

## 2013-08-18 VITALS — BP 146/88 | HR 106 | Temp 98.9°F | Ht 72.0 in | Wt 180.0 lb

## 2013-08-18 DIAGNOSIS — T888XXA Other specified complications of surgical and medical care, not elsewhere classified, initial encounter: Secondary | ICD-10-CM

## 2013-08-18 DIAGNOSIS — L0291 Cutaneous abscess, unspecified: Secondary | ICD-10-CM

## 2013-08-18 LAB — C-REACTIVE PROTEIN: CRP: <0.5 mg/dL (ref <0.60)

## 2013-08-18 LAB — SEDIMENTATION RATE: Sed Rate: 18 mm/hr — ABNORMAL HIGH (ref 0–16)

## 2013-08-18 NOTE — Assessment & Plan Note (Signed)
He has been doing well though is quite anxious with the recent onset of pain. I'm going to check the inflammatory markers again which should decrease. Also will check an x-ray to assure no significant concerns for abscess. If these are improved, he will continue with cefazolin for 2 more weeks. If there is no significant improvement of this inflammatory markers and will consider changing his antibiotics

## 2013-08-18 NOTE — Progress Notes (Signed)
  Subjective:    Patient ID: Raymond [name], male    DOB: July 14, 1966, 47 y.o.   MRN: [id number]  HPI Raymond [name] is a 47 y.o. 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 He was having signficant pain and swelling. He was then admitted and started on cefazolin empirically and went to the OR on 8/18 due to concern of deep infection. Hematoma was found deep with some Sox amount of purulence. After starting cefazolin, prior to surgery, the patient noted significant improvement in his erythema and pain.  Cultures remained negative. His initial sedimentation rate was 85 and CRP of 8.1.  Been going well though has noted pain today. He feels nervous about this pain since he developed pain initially when he developed an infection. He though has had no fever or chills. He did have some drainage from his hip though has stopped. Recent labs have been noted and he did still have an elevated sedimentation rate and CRP.    Review of Systems  Constitutional: Negative for fever, chills and unexpected weight change.  Eyes: Negative for visual disturbance.  Respiratory: Negative for shortness of breath.   Cardiovascular: Negative for leg swelling.  Gastrointestinal: Negative for nausea, vomiting and diarrhea.  Musculoskeletal: Positive for arthralgias. Negative for myalgias, back pain and gait problem.  Skin: Negative for rash.  Neurological: Negative for dizziness, light-headedness and headaches.  Hematological: Negative for adenopathy.  Psychiatric/Behavioral: The patient is nervous/anxious.        Objective:   Physical Exam  Constitutional: He is oriented to person, place, and time. He appears well-developed and well-nourished. No distress.  Eyes: No scleral icterus.  Cardiovascular: Normal rate, regular rhythm and normal heart sounds.   No murmur  heard. Pulmonary/Chest: Effort normal and breath sounds normal. No respiratory distress. He has no wheezes.  Neurological: He is alert and oriented to person, place, and time.  Skin:  PICC line is without erythema  Psychiatric:  He appears anxious about his hip          Assessment & Plan:

## 2013-09-01 ENCOUNTER — Ambulatory Visit (INDEPENDENT_AMBULATORY_CARE_PROVIDER_SITE_OTHER): Payer: BC Managed Care – PPO | Admitting: Internal Medicine

## 2013-09-01 VITALS — BP 142/90 | HR 103 | Temp 99.0°F | Wt 179.0 lb

## 2013-09-01 DIAGNOSIS — Z5189 Encounter for other specified aftercare: Secondary | ICD-10-CM

## 2013-09-01 DIAGNOSIS — T888XXD Other specified complications of surgical and medical care, not elsewhere classified, subsequent encounter: Secondary | ICD-10-CM

## 2013-09-01 NOTE — Progress Notes (Signed)
  Subjective:    Patient ID: Raymond [name], male    DOB: [birth year], 47 y.o.   MRN: [id number]  HPI Raymond [name] is a 47 y.o. 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 He was having signficant pain and swelling. He was then admitted and started on cefazolin empirically and went to the OR on 8/18 due to concern of deep infection. Hematoma was found deep with some Queenan amount of purulence. After starting cefazolin, prior to surgery, the patient noted significant improvement in his erythema and pain. Cultures remained negative. His initial sedimentation rate was 85 and CRP of 8.1 and repeat 2 weeks ago was wnl. He has had no fever or chills, no rash, no drainage, incision closed.  No diarrhea.      Review of Systems  Constitutional: Negative for fever and chills.  Eyes: Negative for visual disturbance.  Respiratory: Negative for cough and shortness of breath.   Gastrointestinal: Negative for nausea, vomiting and diarrhea.  Endocrine: Negative for polyuria.  Musculoskeletal: Negative for myalgias and arthralgias.  Skin: Negative for rash.  Neurological: Negative for dizziness and headaches.  Hematological: Negative for adenopathy.  Psychiatric/Behavioral: Negative for dysphoric mood.       Objective:   Physical Exam  Constitutional: He is oriented to person, place, and time. He appears well-developed and well-nourished. No distress.  Eyes: No scleral icterus.  Neurological: He is alert and oriented to person, place, and time.  Skin: Skin is warm and dry. No rash noted.  Psychiatric: He has a normal mood and affect. His behavior is normal.          Assessment & Plan:

## 2013-09-01 NOTE — Progress Notes (Signed)
Per Dr Luciana Axe 44 cm  Single lumen Peripherally  Inserted  Central Catheter removed from right basilic . No sutures present. Dressing was clean and dry . Area cleansed with chlorhexidine and petroleum dressing applied. Pt advised no heavy lifting with this arm, leave dressing for 24 hours and call the office if dressing becomes soaked with blood or sharp pain presents.  Pt tolerated procedure well.    Laurell Josephs, RN

## 2013-09-01 NOTE — Assessment & Plan Note (Addendum)
He is doing well, ESR and CRP are normal and no fever, chills or new concerns.  Will d/c picc line today and he can follow up clinically.  25 minutes spent with patient including 13 minutes for management Follow up PRN.

## 2014-03-14 ENCOUNTER — Emergency Department (HOSPITAL_COMMUNITY)
Admission: EM | Admit: 2014-03-14 | Discharge: 2014-03-15 | Disposition: A | Discharge status: Home / Self Care | Payer: BC Managed Care – PPO | Attending: Emergency Medicine | Admitting: Emergency Medicine

## 2014-03-14 ENCOUNTER — Emergency Department (HOSPITAL_COMMUNITY): Payer: BC Managed Care – PPO

## 2014-03-14 ENCOUNTER — Encounter (HOSPITAL_COMMUNITY): Payer: Self-pay | Admitting: Emergency Medicine

## 2014-03-14 DIAGNOSIS — Z96649 Presence of unspecified artificial hip joint: Secondary | ICD-10-CM | POA: Insufficient documentation

## 2014-03-14 DIAGNOSIS — Z791 Long term (current) use of non-steroidal anti-inflammatories (NSAID): Secondary | ICD-10-CM | POA: Insufficient documentation

## 2014-03-14 DIAGNOSIS — F329 Major depressive disorder, single episode, unspecified: Secondary | ICD-10-CM | POA: Insufficient documentation

## 2014-03-14 DIAGNOSIS — M25559 Pain in unspecified hip: Secondary | ICD-10-CM | POA: Insufficient documentation

## 2014-03-14 DIAGNOSIS — Z87442 Personal history of urinary calculi: Secondary | ICD-10-CM | POA: Insufficient documentation

## 2014-03-14 DIAGNOSIS — Z79899 Other long term (current) drug therapy: Secondary | ICD-10-CM | POA: Insufficient documentation

## 2014-03-14 DIAGNOSIS — M129 Arthropathy, unspecified: Secondary | ICD-10-CM | POA: Insufficient documentation

## 2014-03-14 DIAGNOSIS — M25551 Pain in right hip: Secondary | ICD-10-CM

## 2014-03-14 DIAGNOSIS — Z8781 Personal history of (healed) traumatic fracture: Secondary | ICD-10-CM | POA: Insufficient documentation

## 2014-03-14 DIAGNOSIS — I10 Essential (primary) hypertension: Secondary | ICD-10-CM | POA: Insufficient documentation

## 2014-03-14 DIAGNOSIS — K219 Gastro-esophageal reflux disease without esophagitis: Secondary | ICD-10-CM | POA: Insufficient documentation

## 2014-03-14 DIAGNOSIS — F3289 Other specified depressive episodes: Secondary | ICD-10-CM | POA: Insufficient documentation

## 2014-03-14 MED ORDER — KETOROLAC TROMETHAMINE 60 MG/2ML IM SOLN
60.0000 mg | Freq: Once | INTRAMUSCULAR | Status: AC
  Administered 2014-03-15: 60 mg via INTRAMUSCULAR
  Filled 2014-03-14: qty 2

## 2014-03-14 NOTE — ED Notes (Signed)
Right hip pain for three weeks,  No known injury,  States had left hip replaced and orthopedic told him only a matter of time before right hip will need replacing.  10/10 throbbing pain  Unable to bear weight on it

## 2014-03-14 NOTE — ED Provider Notes (Signed)
CSN: [id number]     Arrival date & time 03/14/14  2206 History  This chart was scribed for non-physician practitioner Antony MaduraKelly Aerilynn Goin, PA-C working with Gavin PoundMichael Y. Oletta LamasGhim, MD by Danella Maiersaroline Early, ED Scribe. This patient was seen in room WTR5/WTR5 and the patient's care was started at 11:39 PM.    Chief Complaint  Patient presents with  . Hip Pain   The history is provided by the patient. No language interpreter was used.   HPI Comments: Raymond [name] is a 48 y.o. male with a h/o left hip replacement who presents to the Emergency Department complaining of aching and throbbing non-radiating right hip pain onset 3 weeks ago that worsened in the last week. He reports trouble ambulating due to pain and has been using crutches as needed when pain is at its worst. He states he had his left hip replaced by Dr Raylene EvertsMathew Olin and was told it was just a matter of time before he would need his right hip replaced. He called Dr Nilsa Nuttinglin's office and got an appointment for April 9. He has been taking ibuprofen for the pain with no improvement. He denies fever, redness or swelling to R hip, trauma/injury recently, bowel/bladder incontinence, genital/perianal numbness, extremity numbness or weakness.   Past Medical History  Diagnosis Date  . Hypertension   . Depression   . Kidney stones   . GERD (gastroesophageal reflux disease)   . Arthritis   . Rib fractures     left side 06/2013    Past Surgical History  Procedure Laterality Date  . Kidney stone removal     . Refractive surgery    . Right shoulder surgery     . Nasal sinus surgery    . Total hip arthroplasty Left 07/28/2013    Procedure: LEFT TOTAL HIP ARTHROPLASTY ANTERIOR APPROACH;  Surgeon: Shelda PalMatthew D Olin, MD;  Location: [facility];  Service: Orthopedics;  Laterality: Left;  . Joint replacement    . Hematoma evacuation Left 08/03/2013    Procedure: EVACUATION HEMATOMA;  Surgeon: Shelda PalMatthew D Olin, MD;  Location: [facility];  Service: Orthopedics;  Laterality: Left;  .  Incision and drainage abscess Left 08/03/2013    Procedure: INCISION AND DRAINAGE ABSCESS;  Surgeon: Shelda PalMatthew D Olin, MD;  Location: [facility];  Service: Orthopedics;  Laterality: Left;   History reviewed. No pertinent family history. History  Substance Use Topics  . Smoking status: Never Smoker   . Smokeless tobacco: Current User    Types: Chew     Comment: not ready to quit  . Alcohol Use: 2.5 oz/week    5 drink(s) per week     Comment: occasional beer    Review of Systems  Musculoskeletal: Positive for arthralgias (right hip).  Neurological: Negative for weakness and numbness.  All other systems reviewed and are negative.     Allergies  Review of patient's allergies indicates no known allergies.  Home Medications   Current Outpatient Rx  Name  Route  Sig  Dispense  Refill  . atorvastatin (LIPITOR) 40 MG tablet   Oral   Take 40 mg by mouth every morning.         . docusate sodium 100 MG CAPS   Oral   Take 100 mg by mouth 2 (two) times daily.   10 capsule   0   . HYDROcodone-acetaminophen (NORCO/VICODIN) 5-325 MG per tablet   Oral   Take 1-2 tablets by mouth every 6 (six) hours as needed.   17 tablet  0   . indomethacin (INDOCIN) 50 MG suppository   Rectal   Place 50 mg rectally 2 (two) times daily.         Marland Kitchen lisinopril (PRINIVIL,ZESTRIL) 40 MG tablet   Oral   Take 40 mg by mouth every morning.         . methocarbamol (ROBAXIN) 500 MG tablet   Oral   Take 1 tablet (500 mg total) by mouth every 6 (six) hours as needed (muscle spasms).         . naproxen (NAPROSYN) 500 MG tablet   Oral   Take 1 tablet (500 mg total) by mouth 2 (two) times daily.   30 tablet   0   . omeprazole (PRILOSEC) 40 MG capsule   Oral   Take 40 mg by mouth daily.         Marland Kitchen venlafaxine XR (EFFEXOR-XR) 37.5 MG 24 hr capsule   Oral   Take 37.5 mg by mouth daily.         . verapamil (VERELAN) 100 MG 24 hr capsule   Oral   Take 100 mg by mouth at bedtime.           BP 154/84  Pulse 96  Temp(Src) 99.2 F (37.3 C) (Oral)  Resp 20  SpO2 100%  Physical Exam  Nursing note and vitals reviewed. Constitutional: He is oriented to person, place, and time. He appears well-developed and well-nourished. No distress.  HENT:  Head: Normocephalic and atraumatic.  Eyes: Conjunctivae and EOM are normal. No scleral icterus.  Neck: Normal range of motion.  Cardiovascular: Normal rate, regular rhythm and intact distal pulses.   DP pulses 2+ b/l. Capillary refill normal in b/l lower extremities.  Pulmonary/Chest: Effort normal. No respiratory distress.  Musculoskeletal:       Right hip: He exhibits decreased range of motion (secondary to pain), decreased strength (4/5 strength against resistance with flexion and abduction of R hip; 5/5 with hip extension and adduction) and tenderness. He exhibits no crepitus and no deformity.       Right upper leg: Normal.  Neurological: He is alert and oriented to person, place, and time. He exhibits normal muscle tone.  No gross sensory deficits appreciated. Patellar and achilles reflexes 2+ in RLE.  Skin: Skin is warm and dry. No rash noted. He is not diaphoretic. No erythema. No pallor.  Psychiatric: He has a normal mood and affect. His behavior is normal.    ED Course  Procedures (including critical care time) Medications  ketorolac (TORADOL) injection 60 mg (60 mg Intramuscular Given 03/15/14 0008)    DIAGNOSTIC STUDIES: Oxygen Saturation is 100% on RA, normal by my interpretation.    COORDINATION OF CARE: 11:49 PM- Discussed treatment plan with pt which includes hip x-ray. Pt agrees to plan.  Labs Review Labs Reviewed - No data to display Imaging Review Dg Hip Complete Right  03/15/2014   CLINICAL DATA:  Right hip pain.  EXAM: RIGHT HIP - COMPLETE 2+ VIEW  COMPARISON:  DG HIP COMPLETE*L* dated 08/18/2013; DG PELVIS PORTABLE dated 07/28/2013  FINDINGS: Femoral heads are well formed and located. Mild spurring at the  right femoral head neck junction. Hip joint spaces are intact. 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 Sacroiliac joints are symmetric. Moderate right greater left lower lumbar facet arthropathy.  No destructive bony lesions. Included soft tissue planes are non-suspicious.  IMPRESSION: No acute fracture deformity or  dislocation.   Electronically Signed   By: Awilda Metro   On: 03/15/2014 00:38     EKG Interpretation None      MDM   Final diagnoses:  Right hip pain   48 year old male presents for progressively worsening right hip pain x3 weeks. Patient well and nontoxic/nonseptic appearing, hemodynamically stable, and afebrile. He is neurovascularly intact on exam today. No evidence to suggest septic joint. No trauma or injury recently inciting pain. No red flags or signs concerning for cauda equina. Patient states he was told by Dr. Charlann Boxer, his orthopedist, that it was "a matter of time" before he would need his R hip replaced because "the head of the bone was degenerated".   Imaging obtained today which shows no evidence of fracture, deformity, or dislocation. No evidence of osteomyelitis. Joint spaces on Xray appear preserved. There is mild spurring to the R femoral head neck junction which may be the cause of patient's discomfort today. Have reviewed findings with the patient who verbalizes understanding. Have urged orthopedic f/u which patient has, at this time, scheduled for 03/25/14. Will prescribe Naproxen and Norco for symptom control. Return precautions discussed and patient agreeable to plan with no unaddressed concerns.   I personally performed the services described in this documentation, which was scribed in my presence. The recorded information has been reviewed and is accurate.  Filed Vitals:   03/14/14 2230 03/14/14 2255 03/15/14 0102  BP: 135/92 154/84 132/88  Pulse: 91  96 91  Temp: 99.3 F (37.4 C) 99.2 F (37.3 C)   TempSrc: Oral Oral   Resp: 16 20 16   SpO2: 98% 100% 99%     Antony Madura, PA-C 03/15/14 0158

## 2014-03-15 MED ORDER — NAPROXEN 500 MG PO TABS: 500.0000 mg | ORAL_TABLET | Freq: Two times a day (BID) | ORAL | Status: DC

## 2014-03-15 MED ORDER — HYDROCODONE-ACETAMINOPHEN 5-325 MG PO TABS: 1.0000 | ORAL_TABLET | Freq: Four times a day (QID) | ORAL | Status: DC | PRN

## 2014-03-15 NOTE — ED Provider Notes (Signed)
Medical screening examination/treatment/procedure(s) were performed by non-physician practitioner and as supervising physician I was immediately available for consultation/collaboration.  Odean Fester Y. Bianka Liberati, MD 03/15/14 0452 

## 2014-03-15 NOTE — Discharge Instructions (Signed)
Take Naproxen as prescribed. Take Norco as needed for breakthrough pain control. Follow up with your orthopedist.  Arthralgia Your caregiver has diagnosed you as suffering from an arthralgia. Arthralgia means there is pain in a joint. This can come from many reasons including:  Bruising the joint which causes soreness (inflammation) in the joint.  Wear and tear on the joints which occur as we grow older (osteoarthritis).  Overusing the joint.  Various forms of arthritis.  Infections of the joint. Regardless of the cause of pain in your joint, most of these different pains respond to anti-inflammatory drugs and rest. The exception to this is when a joint is infected, and these cases are treated with antibiotics, if it is a bacterial infection. HOME CARE INSTRUCTIONS   Rest the injured area for as long as directed by your caregiver. Then slowly start using the joint as directed by your caregiver and as the pain allows. Crutches as directed may be useful if the ankles, knees or hips are involved. If the knee was splinted or casted, continue use and care as directed. If an stretchy or elastic wrapping bandage has been applied today, it should be removed and re-applied every 3 to 4 hours. It should not be applied tightly, but firmly enough to keep swelling down. Watch toes and feet for swelling, bluish discoloration, coldness, numbness or excessive pain. If any of these problems (symptoms) occur, remove the ace bandage and re-apply more loosely. If these symptoms persist, contact your caregiver or return to this location.  For the first 24 hours, keep the injured extremity elevated on pillows while lying down.  Apply ice for 15-20 minutes to the sore joint every couple hours while awake for the first half day. Then 03-04 times per day for the first 48 hours. Put the ice in a plastic bag and place a towel between the bag of ice and your skin.  Wear any splinting, casting, elastic bandage  applications, or slings as instructed.  Only take over-the-counter or prescription medicines for pain, discomfort, or fever as directed by your caregiver. Do not use aspirin immediately after the injury unless instructed by your physician. Aspirin can cause increased bleeding and bruising of the tissues.  If you were given crutches, continue to use them as instructed and do not resume weight bearing on the sore joint until instructed. Persistent pain and inability to use the sore joint as directed for more than 2 to 3 days are warning signs indicating that you should see a caregiver for a follow-up visit as soon as possible. Initially, a hairline fracture (break in bone) may not be evident on X-rays. Persistent pain and swelling indicate that further evaluation, non-weight bearing or use of the joint (use of crutches or slings as instructed), or further X-rays are indicated. X-rays may sometimes not show a [place] fracture until a week or 10 days later. Make a follow-up appointment with your own caregiver or one to whom we have referred you. A radiologist (specialist in reading X-rays) may read your X-rays. Make sure you know how you are to obtain your X-ray results. Do not assume everything is normal if you do not hear from [place]. SEEK MEDICAL CARE IF: Bruising, swelling, or pain increases. SEEK IMMEDIATE MEDICAL CARE IF:   Your fingers or toes are numb or blue.  The pain is not responding to medications and continues to stay the same or get worse.  The pain in your joint becomes severe.  You develop a fever over  102 F (38.9 C).  It becomes impossible to move or use the joint. MAKE SURE YOU:   Understand these instructions.  Will watch your condition.  Will get help right away if you are not doing well or get worse. Document Released: 12/03/2005 Document Revised: 02/25/2012 Document Reviewed: 07/21/2008 [facility] Patient Information 2014 [place], [place].

## 2014-03-30 ENCOUNTER — Encounter (HOSPITAL_COMMUNITY): Payer: Self-pay | Admitting: Pharmacy Technician

## 2014-03-30 NOTE — Progress Notes (Signed)
Surgery on 04/13/14.  Preop on 04/02/14 at 200pm.  Need orders in EPIC.  Thank You.

## 2014-03-31 NOTE — Progress Notes (Signed)
Surgery on 04/13/14.  Preop on 04/02/14 at 200pm.  Need orders in EPIC.  Thank You.  

## 2014-04-02 ENCOUNTER — Encounter (HOSPITAL_COMMUNITY)
Admission: RE | Admit: 2014-04-02 | Discharge: 2014-04-02 | Disposition: A | Discharge status: Home / Self Care | Payer: BC Managed Care – PPO | Source: Ambulatory Visit | Attending: Orthopedic Surgery | Admitting: Orthopedic Surgery

## 2014-04-02 ENCOUNTER — Encounter (HOSPITAL_COMMUNITY): Payer: Self-pay

## 2014-04-02 DIAGNOSIS — Z01812 Encounter for preprocedural laboratory examination: Secondary | ICD-10-CM | POA: Insufficient documentation

## 2014-04-02 HISTORY — DX: Personal history of urinary calculi: Z87.442

## 2014-04-02 LAB — SURGICAL PCR SCREEN
MRSA, PCR: NEGATIVE
Staphylococcus aureus: NEGATIVE

## 2014-04-02 LAB — CBC
HCT: 39.5 % (ref 39.0–52.0)
Hemoglobin: 12 g/dL — ABNORMAL LOW (ref 13.0–17.0)
MCH: 25.5 pg — AB (ref 26.0–34.0)
MCHC: 30.4 g/dL (ref 30.0–36.0)
MCV: 84 fL (ref 78.0–100.0)
Platelets: 255 10*3/uL (ref 150–400)
RBC: 4.7 MIL/uL (ref 4.22–5.81)
RDW: 16.2 % — AB (ref 11.5–15.5)
WBC: 7.4 10*3/uL (ref 4.0–10.5)

## 2014-04-02 LAB — BASIC METABOLIC PANEL
BUN: 15 mg/dL (ref 6–23)
CALCIUM: 10.1 mg/dL (ref 8.4–10.5)
CO2: 25 meq/L (ref 19–32)
CREATININE: 0.93 mg/dL (ref 0.50–1.35)
Chloride: 97 mEq/L (ref 96–112)
GFR calc Af Amer: >90 mL/min (ref >90)
GFR calc non Af Amer: >90 mL/min (ref >90)
GLUCOSE: 102 mg/dL — AB (ref 70–99)
Potassium: 5.3 mEq/L (ref 3.7–5.3)
Sodium: 134 mEq/L — ABNORMAL LOW (ref 137–147)

## 2014-04-02 LAB — URINALYSIS, ROUTINE W REFLEX MICROSCOPIC
Bilirubin Urine: NEGATIVE
GLUCOSE, UA: NEGATIVE mg/dL
HGB URINE DIPSTICK: NEGATIVE
KETONES UR: NEGATIVE mg/dL
Leukocytes, UA: NEGATIVE
Nitrite: NEGATIVE
PROTEIN: NEGATIVE mg/dL
Specific Gravity, Urine: 1.019 (ref 1.005–1.030)
Urobilinogen, UA: 0.2 mg/dL (ref 0.0–1.0)
pH: 5.5 (ref 5.0–8.0)

## 2014-04-02 LAB — TYPE AND SCREEN
ABO/RH(D): O POS
Antibody Screen: NEGATIVE

## 2014-04-02 LAB — PROTIME-INR
INR: 1.11 (ref 0.00–1.49)
Prothrombin Time: 14.1 seconds (ref 11.6–15.2)

## 2014-04-02 LAB — APTT: aPTT: 27 seconds (ref 24–37)

## 2014-04-02 NOTE — Patient Instructions (Addendum)
   [facility]  ON: Tuesday  4/28  REPORT TO  SHORT STAY CENTER AT:  5:00 AM      PHONE # FOR SHORT STAY IS [phone]  DO NOT EAT OR DRINK ANYTHING AFTER MIDNIGHT THE NIGHT BEFORE YOUR SURGERY.  YOU MAY BRUSH YOUR TEETH, RINSE OUT YOUR MOUTH--BUT NO WATER, NO FOOD, NO CHEWING GUM, NO MINTS, NO CANDIES, NO CHEWING TOBACCO.  PLEASE TAKE THE FOLLOWING MEDICATIONS THE AM OF YOUR SURGERY WITH A FEW SIPS OF WATER:  ATORVASTATIN, OMEPRAZOLE, VERAPAMIL.  DO NOT BRING VALUABLES, MONEY, CREDIT CARDS.  DO NOT WEAR JEWELRY, MAKE-UP, NAIL POLISH AND NO METAL PINS OR CLIPS IN YOUR HAIR. CONTACT LENS, DENTURES / PARTIALS, GLASSES SHOULD NOT BE WORN TO SURGERY AND IN MOST CASES-HEARING AIDS WILL NEED TO BE REMOVED.  BRING YOUR GLASSES CASE, ANY EQUIPMENT NEEDED FOR YOUR CONTACT LENS. FOR PATIENTS ADMITTED TO THE HOSPITAL--CHECK OUT TIME THE DAY OF DISCHARGE IS 11:00 AM.  ALL INPATIENT ROOMS ARE PRIVATE - WITH BATHROOM, TELEPHONE, TELEVISION AND WIFI INTERNET.                                                    PLEASE READ OVER ANY  FACT SHEETS THAT YOU WERE GIVEN: MRSA INFORMATION, BLOOD TRANSFUSION INFORMATION, INCENTIVE SPIROMETER INFORMATION.  FAILURE TO FOLLOW THESE INSTRUCTIONS MAY RESULT IN THE CANCELLATION OF YOUR SURGERY. PLEASE BE AWARE THAT YOU MAY NEED ADDITIONAL BLOOD DRAWN DAY OF YOUR SURGERY  PATIENT SIGNATURE_________________________________

## 2014-04-02 NOTE — Pre-Procedure Instructions (Signed)
EKG AND CXR REPORTS FROM 07-24-13 ARE IN EPIC.

## 2014-04-09 NOTE — H&P (Signed)
TOTAL HIP ADMISSION H&P  Patient is admitted for right total hip arthroplasty, anterior approach.  Subjective:  Chief Complaint:    Right hip AVN / pain  HPI: Raymond [name], 48 y.o. male, has a history of pain and functional disability in the right hip(s) due to arthritis and patient has failed non-surgical conservative treatments for greater than 12 weeks to include NSAID's and/or analgesics, use of assistive devices, activity modification and oral steroids.  Onset of symptoms was gradual starting months ago with rapidlly worsening course since that time.The patient noted prior procedures of the hip to include arthroplasty on the left hip(s).  Patient currently rates pain in the right hip at 9 out of 10 with activity. Patient has night pain, worsening of pain with activity and weight bearing, trendelenberg gait, pain that interfers with activities of daily living and pain with passive range of motion. Patient has evidence of periarticular osteophytes, joint space narrowing and AVN by imaging studies. This condition presents safety issues increasing the risk of falls.  There is no current active infection.   Risks, benefits and expectations were discussed with the patient.  Risks including but not limited to the risk of anesthesia, blood clots, nerve damage, blood vessel damage, failure of the prosthesis, infection and up to and including death.  Patient understand the risks, benefits and expectations and wishes to proceed with surgery.   D/C Plans:   Home with HHPT  Post-op Meds:     No Rx given  Tranexamic Acid:   To be given  Decadron:    To be given  FYI:    ASA post-op     Patient Active Problem List   Diagnosis Date Noted  . Hematoma, postoperative 08/03/2013  . Overweight (BMI 25.0-29.9) 07/29/2013  . Expected blood loss anemia 07/29/2013  . S/P left THA, AA 07/28/2013   Past Medical History  Diagnosis Date  . Hypertension   . GERD (gastroesophageal reflux disease)   . Rib  fractures     left side 06/2013   . Depression     NOT ON ANY MEDS NOW - DOING BETTER NOW  . History of kidney stones   . Arthritis     RIGHT HIP AVASCULAR NECROSIS- PAINFUL HIP;  S/P LEFT  HIP REPLACEMENT 07-28-14    Past Surgical History  Procedure Laterality Date  . Kidney stone removal     . Refractive surgery    . Right shoulder surgery     . Nasal sinus surgery    . Total hip arthroplasty Left 07/28/2013    Procedure: LEFT TOTAL HIP ARTHROPLASTY ANTERIOR APPROACH;  Surgeon: Shelda PalMatthew D Olin, MD;  Location: [facility];  Service: Orthopedics;  Laterality: Left;  . Joint replacement    . Hematoma evacuation Left 08/03/2013    Procedure: EVACUATION HEMATOMA;  Surgeon: Shelda PalMatthew D Olin, MD;  Location: [facility];  Service: Orthopedics;  Laterality: Left;  . Incision and drainage abscess Left 08/03/2013    Procedure: INCISION AND DRAINAGE ABSCESS;  Surgeon: Shelda PalMatthew D Olin, MD;  Location: [facility];  Service: Orthopedics;  Laterality: Left;    No prescriptions prior to admission   Allergies  Allergen Reactions  . Other     PT IS TAKING CEPHALEXIN 500 MG PO ONCE A DAY WITHOUT PROBLEM- BUT STATES THAT IF HE TAKES 4 PER DAY AS WAS ORIGINAL PRESCRIPTION - HE WOULD PASS OUT AND FALL - STATES DR. OLIN AWARE.    History  Substance Use Topics  . Smoking status:  Never Smoker   . Smokeless tobacco: Current User    Types: Chew     Comment: not ready to quit  . Alcohol Use: 2.5 oz/week    5 drink(s) per week     Comment: occasional beer    No family history on file.   Review of Systems  Constitutional: Negative.   HENT: Negative.   Eyes: Negative.   Respiratory: Negative.   Cardiovascular: Negative.   Gastrointestinal: Positive for heartburn.  Genitourinary: Negative.   Musculoskeletal: Positive for joint pain.  Skin: Negative.   Neurological: Negative.   Endo/Heme/Allergies: Negative.   Psychiatric/Behavioral: Positive for depression.    Objective:  Physical Exam  Constitutional: He is  oriented to person, place, and time. He appears well-developed and well-nourished.  HENT:  Head: Normocephalic and atraumatic.  Mouth/Throat: Oropharynx is clear and moist.  Eyes: Pupils are equal, round, and reactive to light.  Neck: Neck supple. No JVD present. No tracheal deviation present. No thyromegaly present.  Cardiovascular: Normal rate, regular rhythm, normal heart sounds and intact distal pulses.   Respiratory: Effort normal and breath sounds normal. No stridor. No respiratory distress. He has no wheezes.  GI: Soft. There is no tenderness. There is no guarding.  Musculoskeletal:       Right hip: He exhibits decreased range of motion, decreased strength, tenderness and bony tenderness. He exhibits no swelling, no deformity and no laceration.  Lymphadenopathy:    He has no cervical adenopathy.  Neurological: He is alert and oriented to person, place, and time.  Skin: Skin is warm and dry.  Psychiatric: He has a normal mood and affect.     Labs:  Estimated body mass index is 24.27 kg/(m^2) as calculated from the following:   Height as of 08/18/13: 6' (1.829 m).   Weight as of 09/01/13: 81.194 kg (179 lb).   Imaging Review Plain radiographs demonstrate severe degenerative joint disease of the right hip(s). The bone quality appears to be good for age and reported activity level.  Assessment/Plan:  End stage arthritis, right hip(s)  The patient history, physical examination, clinical judgement of the provider and imaging studies are consistent with end stage degenerative joint disease of the right hip(s) and total hip arthroplasty is deemed medically necessary. The treatment options including medical management, injection therapy, arthroscopy and arthroplasty were discussed at length. The risks and benefits of total hip arthroplasty were presented and reviewed. The risks due to aseptic loosening, infection, stiffness, dislocation/subluxation,  thromboembolic complications and other  imponderables were discussed.  The patient acknowledged the explanation, agreed to proceed with the plan and consent was signed. Patient is being admitted for inpatient treatment for surgery, pain control, PT, OT, prophylactic antibiotics, VTE prophylaxis, progressive ambulation and ADL's and discharge planning.The patient is planning to be discharged home with home health services.      Anastasio AuerbachMatthew S. Adaliah Hiegel   PAC  04/09/2014, 12:34 PM

## 2014-04-12 NOTE — Anesthesia Preprocedure Evaluation (Addendum)
Anesthesia Evaluation  Patient identified by MRN, date of birth, ID band Patient awake    Reviewed: Allergy & Precautions, H&P , NPO status , Patient's Chart, lab work & pertinent test results  Airway Mallampati: II TM Distance: >3 FB Neck ROM: Full    Dental no notable dental hx.    Pulmonary neg pulmonary ROS,  breath sounds clear to auscultation  Pulmonary exam normal       Cardiovascular hypertension, Pt. on medications Rhythm:Regular Rate:Normal     Neuro/Psych negative neurological ROS  negative psych ROS   GI/Hepatic negative GI ROS, Neg liver ROS, GERD-  Medicated,  Endo/Other  negative endocrine ROS  Renal/GU negative Renal ROS  negative genitourinary   Musculoskeletal negative musculoskeletal ROS (+)   Abdominal   Peds negative pediatric ROS (+)  Hematology negative hematology ROS (+)   Anesthesia Other Findings   Reproductive/Obstetrics negative OB ROS                         Anesthesia Physical Anesthesia Plan  ASA: II  Anesthesia Plan: Spinal   Post-op Pain Management:    Induction: Intravenous  Airway Management Planned: Simple Face Mask  Additional Equipment:   Intra-op Plan:   Post-operative Plan:   Informed Consent: I have reviewed the patients History and Physical, chart, labs and discussed the procedure including the risks, benefits and alternatives for the proposed anesthesia with the patient or authorized representative who has indicated his/her understanding and acceptance.   Dental advisory given  Plan Discussed with: CRNA and Surgeon  Anesthesia Plan Comments:         Anesthesia Quick Evaluation

## 2014-04-13 ENCOUNTER — Ambulatory Visit (HOSPITAL_COMMUNITY): Payer: BC Managed Care – PPO

## 2014-04-13 ENCOUNTER — Inpatient Hospital Stay (HOSPITAL_COMMUNITY): Payer: BC Managed Care – PPO

## 2014-04-13 ENCOUNTER — Ambulatory Visit (HOSPITAL_COMMUNITY): Payer: BC Managed Care – PPO | Admitting: Anesthesiology

## 2014-04-13 ENCOUNTER — Encounter (HOSPITAL_COMMUNITY): Payer: BC Managed Care – PPO | Admitting: Anesthesiology

## 2014-04-13 ENCOUNTER — Encounter (HOSPITAL_COMMUNITY)
Admission: RE | Disposition: A | Discharge status: Home Health | Payer: Self-pay | Source: Ambulatory Visit | Attending: Orthopedic Surgery

## 2014-04-13 ENCOUNTER — Inpatient Hospital Stay (HOSPITAL_COMMUNITY)
Admission: RE | Admit: 2014-04-13 | Discharge: 2014-04-14 | DRG: 470 | Disposition: A | Discharge status: Home Health | Payer: BC Managed Care – PPO | Source: Ambulatory Visit | Attending: Orthopedic Surgery | Admitting: Orthopedic Surgery

## 2014-04-13 ENCOUNTER — Encounter (HOSPITAL_COMMUNITY): Payer: Self-pay | Admitting: *Deleted

## 2014-04-13 DIAGNOSIS — Z6826 Body mass index (BMI) 26.0-26.9, adult: Secondary | ICD-10-CM

## 2014-04-13 DIAGNOSIS — K219 Gastro-esophageal reflux disease without esophagitis: Secondary | ICD-10-CM | POA: Diagnosis present

## 2014-04-13 DIAGNOSIS — Z96649 Presence of unspecified artificial hip joint: Secondary | ICD-10-CM

## 2014-04-13 DIAGNOSIS — M169 Osteoarthritis of hip, unspecified: Secondary | ICD-10-CM | POA: Diagnosis present

## 2014-04-13 DIAGNOSIS — I10 Essential (primary) hypertension: Secondary | ICD-10-CM | POA: Diagnosis present

## 2014-04-13 DIAGNOSIS — M1610 Unilateral primary osteoarthritis, unspecified hip: Secondary | ICD-10-CM | POA: Diagnosis present

## 2014-04-13 DIAGNOSIS — M87059 Idiopathic aseptic necrosis of unspecified femur: Principal | ICD-10-CM | POA: Diagnosis present

## 2014-04-13 DIAGNOSIS — F3289 Other specified depressive episodes: Secondary | ICD-10-CM | POA: Diagnosis present

## 2014-04-13 DIAGNOSIS — Z01812 Encounter for preprocedural laboratory examination: Secondary | ICD-10-CM

## 2014-04-13 DIAGNOSIS — Z87442 Personal history of urinary calculi: Secondary | ICD-10-CM

## 2014-04-13 DIAGNOSIS — Z96641 Presence of right artificial hip joint: Secondary | ICD-10-CM

## 2014-04-13 DIAGNOSIS — E663 Overweight: Secondary | ICD-10-CM | POA: Diagnosis present

## 2014-04-13 DIAGNOSIS — F329 Major depressive disorder, single episode, unspecified: Secondary | ICD-10-CM | POA: Diagnosis present

## 2014-04-13 HISTORY — PX: TOTAL HIP ARTHROPLASTY: SHX124

## 2014-04-13 SURGERY — ARTHROPLASTY, HIP, TOTAL, ANTERIOR APPROACH
Anesthesia: Spinal | Site: Hip | Laterality: Right

## 2014-04-13 MED ORDER — OXYCODONE HCL 5 MG PO TABS
5.0000 mg | ORAL_TABLET | ORAL | Status: DC
  Filled 2014-04-13: qty 3

## 2014-04-13 MED ORDER — DOCUSATE SODIUM 100 MG PO CAPS
100.0000 mg | ORAL_CAPSULE | Freq: Two times a day (BID) | ORAL | Status: DC
  Administered 2014-04-13 – 2014-04-14 (×2): 100 mg via ORAL

## 2014-04-13 MED ORDER — HYDROCODONE-ACETAMINOPHEN 7.5-325 MG PO TABS
1.0000 | ORAL_TABLET | ORAL | Status: DC
  Administered 2014-04-13: 2 via ORAL
  Filled 2014-04-13: qty 2

## 2014-04-13 MED ORDER — FENTANYL CITRATE 0.05 MG/ML IJ SOLN
INTRAMUSCULAR | Status: DC | PRN
  Administered 2014-04-13 (×2): 50 ug via INTRAVENOUS

## 2014-04-13 MED ORDER — DEXAMETHASONE SODIUM PHOSPHATE 10 MG/ML IJ SOLN
INTRAMUSCULAR | Status: AC
  Filled 2014-04-13: qty 1

## 2014-04-13 MED ORDER — ONDANSETRON HCL 4 MG/2ML IJ SOLN: 4.0000 mg | Freq: Four times a day (QID) | INTRAMUSCULAR | Status: DC | PRN

## 2014-04-13 MED ORDER — METHOCARBAMOL 500 MG PO TABS
500.0000 mg | ORAL_TABLET | Freq: Four times a day (QID) | ORAL | Status: DC | PRN
  Administered 2014-04-13 – 2014-04-14 (×2): 500 mg via ORAL
  Filled 2014-04-13 (×2): qty 1

## 2014-04-13 MED ORDER — MIDAZOLAM HCL 2 MG/2ML IJ SOLN
INTRAMUSCULAR | Status: AC
  Filled 2014-04-13: qty 2

## 2014-04-13 MED ORDER — DEXAMETHASONE SODIUM PHOSPHATE 10 MG/ML IJ SOLN: 10.0000 mg | Freq: Once | INTRAMUSCULAR | Status: DC

## 2014-04-13 MED ORDER — CEFAZOLIN SODIUM-DEXTROSE 2-3 GM-% IV SOLR
2.0000 g | INTRAVENOUS | Status: AC
  Administered 2014-04-13: 2 g via INTRAVENOUS

## 2014-04-13 MED ORDER — VERAPAMIL HCL ER 100 MG PO CP24
100.0000 mg | ORAL_CAPSULE | Freq: Every morning | ORAL | Status: DC
  Filled 2014-04-13 (×2): qty 1

## 2014-04-13 MED ORDER — CEFAZOLIN SODIUM-DEXTROSE 2-3 GM-% IV SOLR
INTRAVENOUS | Status: AC
  Filled 2014-04-13: qty 50

## 2014-04-13 MED ORDER — ONDANSETRON HCL 4 MG/2ML IJ SOLN
INTRAMUSCULAR | Status: AC
  Filled 2014-04-13: qty 2

## 2014-04-13 MED ORDER — SODIUM CHLORIDE 0.9 % IV SOLN
1000.0000 mg | Freq: Once | INTRAVENOUS | Status: AC
  Administered 2014-04-13: 1000 mg via INTRAVENOUS
  Filled 2014-04-13: qty 10

## 2014-04-13 MED ORDER — FENTANYL CITRATE 0.05 MG/ML IJ SOLN
INTRAMUSCULAR | Status: AC
  Filled 2014-04-13: qty 2

## 2014-04-13 MED ORDER — DEXAMETHASONE SODIUM PHOSPHATE 10 MG/ML IJ SOLN
10.0000 mg | Freq: Once | INTRAMUSCULAR | Status: AC
  Administered 2014-04-14: 10 mg via INTRAVENOUS
  Filled 2014-04-13: qty 1

## 2014-04-13 MED ORDER — HYDROMORPHONE HCL PF 1 MG/ML IJ SOLN
INTRAMUSCULAR | Status: AC
  Filled 2014-04-13: qty 1

## 2014-04-13 MED ORDER — SENNA 8.6 MG PO TABS
1.0000 | ORAL_TABLET | Freq: Two times a day (BID) | ORAL | Status: DC
  Administered 2014-04-13 – 2014-04-14 (×2): 8.6 mg via ORAL

## 2014-04-13 MED ORDER — PROPOFOL 10 MG/ML IV BOLUS
INTRAVENOUS | Status: AC
  Filled 2014-04-13: qty 20

## 2014-04-13 MED ORDER — PROPOFOL 10 MG/ML IV BOLUS
INTRAVENOUS | Status: DC | PRN
  Administered 2014-04-13 (×2): 30 mg via INTRAVENOUS
  Administered 2014-04-13: 20 mg via INTRAVENOUS
  Administered 2014-04-13 (×2): 30 mg via INTRAVENOUS

## 2014-04-13 MED ORDER — PANTOPRAZOLE SODIUM 40 MG PO TBEC
80.0000 mg | DELAYED_RELEASE_TABLET | Freq: Every day | ORAL | Status: DC
  Administered 2014-04-14: 80 mg via ORAL
  Filled 2014-04-13 (×2): qty 2

## 2014-04-13 MED ORDER — DEXAMETHASONE SODIUM PHOSPHATE 10 MG/ML IJ SOLN
INTRAMUSCULAR | Status: DC | PRN
  Administered 2014-04-13: 10 mg via INTRAVENOUS

## 2014-04-13 MED ORDER — ALUM & MAG HYDROXIDE-SIMETH 200-200-20 MG/5ML PO SUSP: 30.0000 mL | ORAL | Status: DC | PRN

## 2014-04-13 MED ORDER — FERROUS SULFATE 325 (65 FE) MG PO TABS
325.0000 mg | ORAL_TABLET | Freq: Three times a day (TID) | ORAL | Status: DC
  Administered 2014-04-13 – 2014-04-14 (×2): 325 mg via ORAL
  Filled 2014-04-13 (×5): qty 1

## 2014-04-13 MED ORDER — CHLORHEXIDINE GLUCONATE 4 % EX LIQD: 60.0000 mL | Freq: Once | CUTANEOUS | Status: DC

## 2014-04-13 MED ORDER — ACETAMINOPHEN 10 MG/ML IV SOLN
1000.0000 mg | Freq: Once | INTRAVENOUS | Status: AC
  Administered 2014-04-13: 1000 mg via INTRAVENOUS
  Filled 2014-04-13: qty 100

## 2014-04-13 MED ORDER — ASPIRIN EC 325 MG PO TBEC
325.0000 mg | DELAYED_RELEASE_TABLET | Freq: Two times a day (BID) | ORAL | Status: DC
  Administered 2014-04-13 – 2014-04-14 (×2): 325 mg via ORAL
  Filled 2014-04-13 (×6): qty 1

## 2014-04-13 MED ORDER — MIDAZOLAM HCL 5 MG/5ML IJ SOLN
INTRAMUSCULAR | Status: DC | PRN
  Administered 2014-04-13: 2 mg via INTRAVENOUS

## 2014-04-13 MED ORDER — OXYCODONE HCL 5 MG PO TABS: 5.0000 mg | ORAL_TABLET | ORAL | Status: DC

## 2014-04-13 MED ORDER — DSS 100 MG PO CAPS: 100.0000 mg | ORAL_CAPSULE | Freq: Two times a day (BID) | ORAL | Status: AC

## 2014-04-13 MED ORDER — PHENYLEPHRINE HCL 10 MG/ML IJ SOLN
10.0000 mg | INTRAVENOUS | Status: DC | PRN
  Administered 2014-04-13: 40 ug/min via INTRAVENOUS

## 2014-04-13 MED ORDER — ONDANSETRON HCL 4 MG PO TABS: 4.0000 mg | ORAL_TABLET | Freq: Four times a day (QID) | ORAL | Status: DC | PRN

## 2014-04-13 MED ORDER — CEFAZOLIN SODIUM-DEXTROSE 2-3 GM-% IV SOLR
2.0000 g | Freq: Four times a day (QID) | INTRAVENOUS | Status: AC
  Administered 2014-04-13: 2 g via INTRAVENOUS
  Filled 2014-04-13 (×2): qty 50

## 2014-04-13 MED ORDER — 0.9 % SODIUM CHLORIDE (POUR BTL) OPTIME
TOPICAL | Status: DC | PRN
  Administered 2014-04-13: 1000 mL

## 2014-04-13 MED ORDER — EPINEPHRINE HCL 0.1 MG/ML IJ SOSY
PREFILLED_SYRINGE | INTRAMUSCULAR | Status: DC | PRN
  Administered 2014-04-13 (×2): 10 ug via INTRAVENOUS

## 2014-04-13 MED ORDER — HYDROMORPHONE HCL PF 1 MG/ML IJ SOLN
0.2500 mg | INTRAMUSCULAR | Status: DC | PRN
Start: 2014-04-13 — End: 2014-04-13
  Administered 2014-04-13 (×4): 0.5 mg via INTRAVENOUS

## 2014-04-13 MED ORDER — POLYETHYLENE GLYCOL 3350 17 G PO PACK: 17.0000 g | PACK | Freq: Every day | ORAL | Status: DC | PRN

## 2014-04-13 MED ORDER — LACTATED RINGERS IV SOLN
INTRAVENOUS | Status: DC | PRN
  Administered 2014-04-13 (×3): via INTRAVENOUS

## 2014-04-13 MED ORDER — POLYETHYLENE GLYCOL 3350 17 G PO PACK: 17.0000 g | PACK | Freq: Every day | ORAL | Status: AC | PRN

## 2014-04-13 MED ORDER — MENTHOL 3 MG MT LOZG: 1.0000 | LOZENGE | OROMUCOSAL | Status: DC | PRN

## 2014-04-13 MED ORDER — BUPIVACAINE IN DEXTROSE 0.75-8.25 % IT SOLN
INTRATHECAL | Status: DC | PRN
  Administered 2014-04-13: 2 mL via INTRATHECAL

## 2014-04-13 MED ORDER — EPHEDRINE SULFATE 50 MG/ML IJ SOLN
INTRAMUSCULAR | Status: DC | PRN
Start: 2014-04-13 — End: 2014-04-13
  Administered 2014-04-13 (×2): 5 mg via INTRAVENOUS

## 2014-04-13 MED ORDER — PHENYLEPHRINE HCL 10 MG/ML IJ SOLN
INTRAMUSCULAR | Status: DC | PRN
  Administered 2014-04-13 (×3): 40 ug via INTRAVENOUS

## 2014-04-13 MED ORDER — PROMETHAZINE HCL 25 MG/ML IJ SOLN: 6.2500 mg | INTRAMUSCULAR | Status: DC | PRN

## 2014-04-13 MED ORDER — OXYCODONE HCL 5 MG PO TABS
5.0000 mg | ORAL_TABLET | ORAL | Status: DC
  Administered 2014-04-13 – 2014-04-14 (×6): 15 mg via ORAL
  Filled 2014-04-13 (×5): qty 3

## 2014-04-13 MED ORDER — PHENOL 1.4 % MT LIQD
1.0000 | OROMUCOSAL | Status: DC | PRN
Start: 2014-04-13 — End: 2014-04-14

## 2014-04-13 MED ORDER — EPHEDRINE SULFATE 50 MG/ML IJ SOLN
INTRAMUSCULAR | Status: AC
  Filled 2014-04-13: qty 1

## 2014-04-13 MED ORDER — METHOCARBAMOL 100 MG/ML IJ SOLN
500.0000 mg | Freq: Four times a day (QID) | INTRAVENOUS | Status: DC | PRN
  Administered 2014-04-13: 500 mg via INTRAVENOUS
  Filled 2014-04-13: qty 5

## 2014-04-13 MED ORDER — PHENYLEPHRINE HCL 10 MG/ML IJ SOLN
INTRAMUSCULAR | Status: AC
  Filled 2014-04-13: qty 1

## 2014-04-13 MED ORDER — HYDROMORPHONE HCL PF 1 MG/ML IJ SOLN
0.5000 mg | INTRAMUSCULAR | Status: DC | PRN
  Administered 2014-04-13 – 2014-04-14 (×6): 1 mg via INTRAVENOUS
  Filled 2014-04-13 (×5): qty 1

## 2014-04-13 MED ORDER — PHENYLEPHRINE 40 MCG/ML (10ML) SYRINGE FOR IV PUSH (FOR BLOOD PRESSURE SUPPORT)
PREFILLED_SYRINGE | INTRAVENOUS | Status: AC
  Filled 2014-04-13: qty 10

## 2014-04-13 MED ORDER — CEFAZOLIN SODIUM-DEXTROSE 2-3 GM-% IV SOLR
2.0000 g | Freq: Once | INTRAVENOUS | Status: AC
  Administered 2014-04-13: 2 g via INTRAVENOUS
  Filled 2014-04-13: qty 50

## 2014-04-13 MED ORDER — METHOCARBAMOL 500 MG PO TABS: 500.0000 mg | ORAL_TABLET | Freq: Four times a day (QID) | ORAL | Status: DC | PRN

## 2014-04-13 MED ORDER — SODIUM CHLORIDE 0.9 % IV SOLN
INTRAVENOUS | Status: DC
  Administered 2014-04-13: 12:00:00 via INTRAVENOUS
  Filled 2014-04-13 (×4): qty 1000

## 2014-04-13 MED ORDER — DIPHENHYDRAMINE HCL 12.5 MG/5ML PO ELIX: 25.0000 mg | ORAL_SOLUTION | Freq: Four times a day (QID) | ORAL | Status: DC | PRN

## 2014-04-13 MED ORDER — SODIUM CHLORIDE 0.9 % IJ SOLN
INTRAMUSCULAR | Status: AC
  Filled 2014-04-13: qty 10

## 2014-04-13 MED ORDER — PROPOFOL INFUSION 10 MG/ML OPTIME
INTRAVENOUS | Status: DC | PRN
  Administered 2014-04-13: 75 ug/kg/min via INTRAVENOUS

## 2014-04-13 SURGICAL SUPPLY — 32 items
BAG ZIPLOCK 12X15 (MISCELLANEOUS) IMPLANT
CAPT HIP PF COP ×3 IMPLANT
DERMABOND ADVANCED (GAUZE/BANDAGES/DRESSINGS) ×2
DERMABOND ADVANCED .7 DNX12 (GAUZE/BANDAGES/DRESSINGS) ×1 IMPLANT
DRAPE C-ARM 42X120 X-RAY (DRAPES) ×3 IMPLANT
DRAPE STERI IOBAN 125X83 (DRAPES) ×3 IMPLANT
DRAPE U-SHAPE 47X51 STRL (DRAPES) ×9 IMPLANT
DRSG AQUACEL AG ADV 3.5X10 (GAUZE/BANDAGES/DRESSINGS) ×3 IMPLANT
DURAPREP 26ML APPLICATOR (WOUND CARE) ×3 IMPLANT
ELECT BLADE TIP CTD 4 INCH (ELECTRODE) ×3 IMPLANT
ELECT PENCIL ROCKER SW 15FT (MISCELLANEOUS) ×3 IMPLANT
ELECT REM PT RETURN 15FT ADLT (MISCELLANEOUS) ×3 IMPLANT
FACESHIELD WRAPAROUND (MASK) ×9 IMPLANT
GLOVE BIOGEL PI IND STRL 7.5 (GLOVE) ×1 IMPLANT
GLOVE BIOGEL PI IND STRL 8 (GLOVE) ×1 IMPLANT
GLOVE BIOGEL PI INDICATOR 7.5 (GLOVE) ×2
GLOVE BIOGEL PI INDICATOR 8 (GLOVE) ×2
GLOVE ECLIPSE 8.0 STRL XLNG CF (GLOVE) ×3 IMPLANT
GLOVE ORTHO TXT STRL SZ7.5 (GLOVE) ×6 IMPLANT
GOWN SPEC L3 XXLG W/TWL (GOWN DISPOSABLE) ×6 IMPLANT
GOWN STRL REUS W/TWL LRG LVL3 (GOWN DISPOSABLE) ×6 IMPLANT
KIT BASIN OR (CUSTOM PROCEDURE TRAY) ×3 IMPLANT
PACK TOTAL JOINT (CUSTOM PROCEDURE TRAY) ×3 IMPLANT
PADDING CAST COTTON 6X4 STRL (CAST SUPPLIES) ×3 IMPLANT
SAW OSC TIP CART 19.5X105X1.3 (SAW) ×3 IMPLANT
SUT MNCRL AB 4-0 PS2 18 (SUTURE) ×3 IMPLANT
SUT VIC AB 1 CT1 36 (SUTURE) ×9 IMPLANT
SUT VIC AB 2-0 CT1 27 (SUTURE) ×4
SUT VIC AB 2-0 CT1 TAPERPNT 27 (SUTURE) ×2 IMPLANT
SUT VLOC 180 0 24IN GS25 (SUTURE) ×3 IMPLANT
TOWEL OR 17X26 10 PK STRL BLUE (TOWEL DISPOSABLE) ×3 IMPLANT
TRAY FOLEY METER SIL LF 16FR (CATHETERS) ×3 IMPLANT

## 2014-04-13 NOTE — Anesthesia Procedure Notes (Signed)
Spinal  Patient location during procedure: OR Start time: 04/13/2014 7:17 AM End time: 04/13/2014 7:25 AM Staffing Anesthesiologist: ROSE, Iona Beard CRNA/Resident: Anne Fu Performed by: anesthesiologist and resident/CRNA  Preanesthetic Checklist Completed: patient identified, site marked, surgical consent, pre-op evaluation, timeout performed, IV checked, risks and benefits discussed and monitors and equipment checked Spinal Block Patient position: sitting Prep: Betadine Patient monitoring: heart rate, continuous pulse ox and blood pressure Approach: midline Location: L3-4 Injection technique: single-shot Needle Needle type: Sprotte  Needle gauge: 24 G Needle length: 9 cm Assessment Sensory level: T4 Additional Notes Expiration date of kit checked and confirmed. Patient tolerated procedure well, without complications. X 2 attempts first per [facility] CRNA L2-L3 without success, MD Lynelle Doctor 1 attempt with noted CSF return noted sensory and motor loss on exam post administration.

## 2014-04-13 NOTE — Op Note (Signed)
NAME:  Raymond [name]                ACCOUNT NO.: [id number]      MEDICAL RECORD NO.: [id number]      FACILITY:  [facility]      PHYSICIAN:  Shelda Pal  DATE OF BIRTH:  03/10/1966     DATE OF PROCEDURE:  04/13/2014                                 OPERATIVE REPORT         PREOPERATIVE DIAGNOSIS: Right  Avascular necrosis.      POSTOPERATIVE DIAGNOSIS:  Right hip avascular necrosis.      PROCEDURE:  Right total hip replacement through an anterior approach   utilizing DePuy THR system, component size 54mm pinnacle cup, a size 36+4 neutral   Altrex liner, a size 10 Hi Tri Lock stem with a 36+1.5 delta ceramic   ball.      SURGEON:  Madlyn Frankel. Charlann Boxer, M.D.      ASSISTANT:  Leilani Able, PA-C     ANESTHESIA:  Spinal.      SPECIMENS:  None.      COMPLICATIONS:  None.      BLOOD LOSS:  250 cc     DRAINS:  None.      INDICATION OF THE PROCEDURE:  Raymond [name] is a 48 y.o. male who had   presented to office for evaluation of right hip pain.  Radiographs revealed   progressive avascular changes with bone collapse.  He had this on his left hip and now ready to have his right hip replaced.  The patient had painful limited range of   motion significantly affecting their overall quality of life.  The patient was failing to    respond to conservative measures, and at this point was ready   to proceed with more definitive measures.  The patient has noted progressive   degenerative changes in his hip, progressive problems and dysfunction   with regarding the hip prior to surgery.  Consent was obtained for   benefit of pain relief.  Specific risk of infection, DVT, component   failure, dislocation, need for revision surgery, as well discussion of   the anterior versus posterior approach were reviewed.  Consent was   obtained for benefit of anterior pain relief through an anterior   approach.      PROCEDURE IN DETAIL:  The patient was brought to operative theater.   Once adequate anesthesia, preoperative antibiotics, 2gm of Ancef administered.   The patient was positioned supine on the OSI Hanna table.  Once adequate   padding of boney process was carried out, we had predraped out the hip, and  used fluoroscopy to confirm orientation of the pelvis and position.      The right hip was then prepped and draped from proximal iliac crest to   mid thigh with shower curtain technique.      Time-out was performed identifying the patient, planned procedure, and   extremity.     An incision was then made 2 cm distal and lateral to the   anterior superior iliac spine extending over the orientation of the   tensor fascia lata muscle and sharp dissection was carried down to the   fascia of the muscle and protractor placed in the soft tissues.      The fascia  was then incised.  The muscle belly was identified and swept   laterally and retractor placed along the superior neck.  Following   cauterization of the circumflex vessels and removing some pericapsular   fat, a second cobra retractor was placed on the inferior neck.  A third   retractor was placed on the anterior acetabulum after elevating the   anterior rectus.  A L-capsulotomy was along the line of the   superior neck to the trochanteric fossa, then extended proximally and   distally.  Tag sutures were placed and the retractors were then placed   intracapsular.  We then identified the trochanteric fossa and   orientation of my neck cut, confirmed this radiographically   and then made a neck osteotomy with the femur on traction.  The femoral   head was removed without difficulty or complication.  Traction was let   off and retractors were placed posterior and anterior around the   acetabulum.      The labrum and foveal tissue were debrided.  I began reaming with a 49mm   reamer and reamed up to 53mm reamer with good bony bed preparation and a 54   cup was chosen.  The final 54mm Pinnacle cup was then  impacted under fluoroscopy  to confirm the depth of penetration and orientation with respect to   abduction.  A screw was placed followed by the hole eliminator.  The final   36+4 neutral Altrex liner was impacted with good visualized rim fit.  The cup was positioned anatomically within the acetabular portion of the pelvis.      At this point, the femur was rolled at 80 degrees.  Further capsule was   released off the inferior aspect of the femoral neck.  I then   released the superior capsule proximally.  The hook was placed laterally   along the femur and elevated manually and held in position with the bed   hook.  The leg was then extended and adducted with the leg rolled to 100   degrees of external rotation.  Once the proximal femur was fully   exposed, I used a box osteotome to set orientation.  I then began   broaching with the starting chili pepper broach and passed this by hand and then broached up to 10 to match the contralateral hip.  With the 10 broach in place I chose a high offset neck and did a trial reduction.  The offset was appropriate, leg lengths   appeared to be equal, confirmed radiographically.   Given these findings, I went ahead and dislocated the hip, repositioned all   retractors and positioned the right hip in the extended and abducted position.  The final 10 Hi Tri Lock stem was   chosen and it was impacted down to the level of neck cut.  Based on this   and the trial reduction, a 36+1.5 delta ceramic ball was chosen and   impacted onto a clean and dry trunnion, and the hip was reduced.  The   hip had been irrigated throughout the case again at this point.  I did   reapproximate the superior capsular leaflet to the anterior leaflet   using #1 Vicryl.  The fascia of the   tensor fascia lata muscle was then reapproximated using #1 Vicryl and #0 V-lock sutures.  The   remaining wound was closed with 2-0 Vicryl and running 4-0 Monocryl.   The hip was cleaned, dried,  and dressed  sterilely using Dermabond and   Aquacel dressing.  She was then brought   to recovery room in stable condition tolerating the procedure well.    Leilani Able, PA-C was present for the entirety of the case involved from   preoperative positioning, perioperative retractor management, general   facilitation of the case, as well as primary wound closure as assistant.            Madlyn Frankel Charlann Boxer, M.D.        04/13/2014 8:41 AM

## 2014-04-13 NOTE — Evaluation (Signed)
Physical Therapy Evaluation Patient Details Name: Raymond [name] MRN: [id number] DOB: 10/29/1966 Today's Date: 04/13/2014   History of Present Illness  48 yo male s/p R THA-direct anterior 4/28.   Clinical Impression  On eval, pt required Min assist for mobility-able to ambulate ~30 feet with walker. Recommend HHPt.     Follow Up Recommendations Home health PT    Equipment Recommendations  None recommended by PT    Recommendations for Other Services OT consult     Precautions / Restrictions Precautions Precautions: Fall Restrictions Weight Bearing Restrictions: No RLE Weight Bearing: Weight bearing as tolerated      Mobility  Bed Mobility Overal bed mobility: Needs Assistance Bed Mobility: Sit to Supine;Supine to Sit     Supine to sit: Min guard Sit to supine: Min assist   General bed mobility comments: Assist for R LE   Transfers Overall transfer level: Needs assistance Equipment used: Rolling walker (2 wheeled) Transfers: Sit to/from Stand Sit to Stand: Min assist         General transfer comment: VCs safety, technique, hand placement. Assist to rise, stabilize, control descent  Ambulation/Gait Ambulation/Gait assistance: Min assist Ambulation Distance (Feet): 30 Feet Assistive device: Rolling walker (2 wheeled) Gait Pattern/deviations: Step-to pattern;Decreased stride length;Decreased step length - right     General Gait Details: VCs safety, technique, sequence. Assist to stabilize throughout ambulation.   Stairs            Wheelchair Mobility    Modified Rankin (Stroke Patients Only)       Balance                                             Pertinent Vitals/Pain 7/10 R LE    Home Living Family/patient expects to be discharged to:: Private residence Living Arrangements: Alone Available Help at Discharge: Family   Home Access: Stairs to enter     Home Layout: One level Home Equipment: Crutches;Walker - 2  wheels;Bedside commode Additional Comments: Mother lives next door    Prior Function Level of Independence: Independent with assistive device(s)         Comments: hx of falls with hip giving way     Hand Dominance   Dominant Hand: Right    Extremity/Trunk Assessment   Upper Extremity Assessment: Overall WFL for tasks assessed           Lower Extremity Assessment: RLE deficits/detail RLE Deficits / Details: hip flex 3/5, hip abd/add 3/5, moves ankle well    Cervical / Trunk Assessment: Normal  Communication   Communication: No difficulties  Cognition Arousal/Alertness: Awake/alert Behavior During Therapy: WFL for tasks assessed/performed Overall Cognitive Status: Within Functional Limits for tasks assessed                      General Comments      Exercises        Assessment/Plan    PT Assessment Patient needs continued PT services  PT Diagnosis Difficulty walking;Abnormality of gait;Acute pain   PT Problem List Decreased strength;Decreased range of motion;Decreased activity tolerance;Decreased balance;Decreased mobility;Pain;Decreased knowledge of use of DME  PT Treatment Interventions DME instruction;Gait training;Stair training;Functional mobility training;Therapeutic activities;Therapeutic exercise;Patient/family education;Balance training   PT Goals (Current goals can be found in the Care Plan section) Acute Rehab PT Goals Patient Stated Goal: home soon PT Goal Formulation: With patient Time  For Goal Achievement: 04/20/14 Potential to Achieve Goals: Good    Frequency 7X/week   Barriers to discharge        Co-evaluation               End of Session Equipment Utilized During Treatment: Gait belt Activity Tolerance: Patient limited by pain Patient left: with call bell/phone within reach           Time: 1610-1626 PT Time Calculation (min): 16 min   Charges:   PT Evaluation $Initial PT Evaluation Tier I: 1 Procedure PT  Treatments $Gait Training: 8-22 mins   PT G Codes:          Rebeca AlertJannie Thierry Dobosz, MPT Pager: [phone]

## 2014-04-13 NOTE — Progress Notes (Signed)
Utilization review completed.  

## 2014-04-13 NOTE — Discharge Instructions (Signed)
Weightbearing as tolerated. °You may shower but the bandage should remain on.  °Take aspirin 325mg twice daily for prevention of blood clots. °

## 2014-04-13 NOTE — Interval H&P Note (Signed)
History and Physical Interval Note:  04/13/2014 6:56 AM  Raymond [name]  has presented today for surgery, with the diagnosis of AVN RIGHT HIP  The various methods of treatment have been discussed with the patient and family. After consideration of risks, benefits and other options for treatment, the patient has consented to  Procedure(s): RIGHT TOTAL HIP ARTHROPLASTY ANTERIOR APPROACH (Right) as a surgical intervention .  The patient's history has been reviewed, patient examined, no change in status, stable for surgery.  I have reviewed the patient's chart and labs.  Questions were answered to the patient's satisfaction.     Shelda PalMatthew D Namrata Dangler

## 2014-04-13 NOTE — Transfer of Care (Signed)
Immediate Anesthesia Transfer of Care Note  Patient: Raymond [name]  Procedure(s) Performed: Procedure(s) (LRB): RIGHT TOTAL HIP ARTHROPLASTY ANTERIOR APPROACH (Right)  Patient Location: PACU  Anesthesia Type: Spinal  Level of Consciousness: sedated, patient cooperative and responds to stimulation  Airway & Oxygen Therapy: Patient Spontanous Breathing and Patient connected to face mask oxgen  Post-op Assessment: Report given to PACU RN and Post -op Vital signs reviewed and stable  Post vital signs: Reviewed and stable. S1 level on exam with noted movement to lower ext, slight pain to right hip on assessment.   Complications: No apparent anesthesia complications

## 2014-04-13 NOTE — Anesthesia Postprocedure Evaluation (Signed)
  Anesthesia Post-op Note  Patient: Raymond [name]  Procedure(s) Performed: Procedure(s) (LRB): RIGHT TOTAL HIP ARTHROPLASTY ANTERIOR APPROACH (Right)  Patient Location: PACU  Anesthesia Type: Spinal  Level of Consciousness: awake and alert   Airway and Oxygen Therapy: Patient Spontanous Breathing  Post-op Pain: mild  Post-op Assessment: Post-op Vital signs reviewed, Patient's Cardiovascular Status Stable, Respiratory Function Stable, Patent Airway and No signs of Nausea or vomiting  Last Vitals:  Filed Vitals:   04/13/14 0930  BP:   Pulse: 80  Temp: 36.6 C  Resp: 18    Post-op Vital Signs: stable   Complications: No apparent anesthesia complications

## 2014-04-14 ENCOUNTER — Encounter (HOSPITAL_COMMUNITY): Payer: Self-pay | Admitting: Orthopedic Surgery

## 2014-04-14 LAB — CBC
HCT: 36.5 % — ABNORMAL LOW (ref 39.0–52.0)
Hemoglobin: 11.3 g/dL — ABNORMAL LOW (ref 13.0–17.0)
MCH: 25.9 pg — ABNORMAL LOW (ref 26.0–34.0)
MCHC: 31 g/dL (ref 30.0–36.0)
MCV: 83.5 fL (ref 78.0–100.0)
PLATELETS: 214 10*3/uL (ref 150–400)
RBC: 4.37 MIL/uL (ref 4.22–5.81)
RDW: 16.7 % — ABNORMAL HIGH (ref 11.5–15.5)
WBC: 12.7 10*3/uL — AB (ref 4.0–10.5)

## 2014-04-14 LAB — BASIC METABOLIC PANEL
BUN: 7 mg/dL (ref 6–23)
CHLORIDE: 97 meq/L (ref 96–112)
CO2: 26 meq/L (ref 19–32)
Calcium: 9.7 mg/dL (ref 8.4–10.5)
Creatinine, Ser: 0.72 mg/dL (ref 0.50–1.35)
GFR calc Af Amer: >90 mL/min (ref >90)
GFR calc non Af Amer: >90 mL/min (ref >90)
Glucose, Bld: 153 mg/dL — ABNORMAL HIGH (ref 70–99)
Potassium: 3.8 mEq/L (ref 3.7–5.3)
Sodium: 137 mEq/L (ref 137–147)

## 2014-04-14 MED ORDER — VERAPAMIL HCL ER 100 MG PO CP24: 100.0000 mg | ORAL_CAPSULE | Freq: Every morning | ORAL | Status: DC

## 2014-04-14 MED ORDER — VERAPAMIL HCL ER 120 MG PO TBCR
120.0000 mg | EXTENDED_RELEASE_TABLET | Freq: Once | ORAL | Status: AC
  Administered 2014-04-14: 120 mg via ORAL
  Filled 2014-04-14: qty 1

## 2014-04-14 NOTE — Progress Notes (Signed)
RN reviewed discharge instructions with patient and family. All questions answered. Patient stated understanding of pain management, proper positioning, antibiotic use at home, stool softeners and laxatives, aspirin regimen, and showering with surgical dressing intact.   Paperwork and prescriptions given.   NT rolled patient down to family car.

## 2014-04-14 NOTE — Evaluation (Signed)
Occupational Therapy Evaluation Patient Details Name: Raymond [name] D Fruchter MRN: [id number] DOB: 05/02/1966 Today's Date: 04/14/2014    History of Present Illness 48 yo male s/p R THA-direct anterior 4/28.    Clinical Impression   Pt is doing well POD 1 with mobility, although somewhat shaky requiring close presence of therapist for OOB.  This is the pt's second direct anterior hip replacement.  All education completed.  Pt will have assist of his mother upon discharge home.  No further OT needs.   Follow Up Recommendations  No OT follow up    Equipment Recommendations  None recommended by OT    Recommendations for Other Services       Precautions / Restrictions Precautions Precautions: Fall Restrictions Weight Bearing Restrictions: No RLE Weight Bearing: Weight bearing as tolerated      Mobility Bed Mobility Overal bed mobility: Modified Independent Bed Mobility: Supine to Sit     Supine to sit: Modified independent (Device/Increase time)     General bed mobility comments: no physical assist needed  Transfers Overall transfer level: Needs assistance Equipment used: Rolling walker (2 wheeled) Transfers: Sit to/from Stand Sit to Stand: Min guard              Balance                                            ADL Overall ADL's : Needs assistance/impaired Eating/Feeding: Independent;Sitting   Grooming: Wash/dry hands;Min guard;Standing   Upper Body Bathing: Set up;Sitting   Lower Body Bathing: Minimal assistance;Sit to/from stand   Upper Body Dressing : Set up;Sitting   Lower Body Dressing: Minimal assistance;Sit to/from stand   Toilet Transfer: Min guard;Ambulation           Functional mobility during ADLs: Min guard;Rolling walker General ADL Comments: Pt shakey when up, may be related to pain.  Pt's mother has a Sports administratorreacher he can use, instructed in LB dressing with reacher and to dress R LE first.  Pt instructed in use of long sponge,  will purchase in community.  Does not plan to shower until he returns to [place].  Recommended pt practice shower transfer with HHPT first time. Pt plans to wear slip on shoes and avoid socks.  Instructed in safe footwear.     Vision                     Perception     Praxis      Pertinent Vitals/Pain HR around 115, pain 6/10, premedicated, RN is aware     Hand Dominance Right   Extremity/Trunk Assessment Upper Extremity Assessment Upper Extremity Assessment: Overall WFL for tasks assessed   Lower Extremity Assessment Lower Extremity Assessment: Defer to PT evaluation   Cervical / Trunk Assessment Cervical / Trunk Assessment: Normal   Communication Communication Communication: No difficulties   Cognition Arousal/Alertness: Awake/alert Behavior During Therapy: WFL for tasks assessed/performed Overall Cognitive Status: Within Functional Limits for tasks assessed                     General Comments       Exercises       Shoulder Instructions      Home Living Family/patient expects to be discharged to:: Private residence Living Arrangements: Alone Available Help at Discharge: Family Type of Home: House Home Access: Stairs to enter Entergy CorporationEntrance Stairs-Number  of Steps: 4 Entrance Stairs-Rails: Right;Left;Can reach both Home Layout: One level     Bathroom Shower/Tub: Tub/shower unit;Curtain   FirefighterBathroom Toilet: Standard     Home Equipment: Crutches;Walker - 2 wheels;Bedside commode;Adaptive equipment Adaptive Equipment: Reacher Additional Comments: Mother lives next door      Prior Functioning/Environment Level of Independence: Independent with assistive device(s)        Comments: hx of falls with hip giving way    OT Diagnosis:     OT Problem List:     OT Treatment/Interventions:      OT Goals(Current goals can be found in the care plan section) Acute Rehab OT Goals Patient Stated Goal: home soon  OT Frequency:     Barriers to D/C:             Co-evaluation              End of Session Nurse Communication:  (pt requesting stomach meds)  Activity Tolerance: Patient limited by pain Patient left: in chair;with call bell/phone within reach   Time: 0810-0840 OT Time Calculation (min): 30 min Charges:  OT General Charges $OT Visit: 1 Procedure OT Evaluation $Initial OT Evaluation Tier I: 1 Procedure OT Treatments $Self Care/Home Management : 8-22 mins G-Codes:    Dayton BailiffJulie Lynn Nahomi Hegner 04/14/2014, 8:47 AM [phone]

## 2014-04-14 NOTE — Plan of Care (Signed)
Problem: Consults Goal: Diagnosis- Total Joint Replacement Outcome: Completed/Met Date Met:  04/14/14 Primary Total Hip RIGHT, Anterior

## 2014-04-14 NOTE — Care Management Note (Signed)
    Page 1 of 1   04/14/2014     10:45:48 AM CARE MANAGEMENT NOTE 04/14/2014  Patient:  Raymond [name],Raymond [name]   Account Number:  [id number]  Date Initiated:  04/14/2014  Documentation initiated by:  [facility]FFRIES,Alvah Gilder  Subjective/Objective Assessment:   adm: Right hip AVN/pain; R Total Hip Replacement     Action/Plan:   discharge planning   Anticipated DC Date:  04/14/2014   Anticipated DC Plan:  HOME W HOME HEALTH SERVICES      DC Planning Services  CM consult      [facility]  [facility]   Choice offered to / List presented to:  C-1 Patient        HH arranged  HH-2 PT      [facility]  [facility]   Status of service:  Completed, signed off Medicare Important Message given?   (If response is "NO", the following Medicare IM given date fields will be blank) Date Medicare IM given:   Date Additional Medicare IM given:    Discharge Disposition:  HOME W HOME HEALTH SERVICES  Per UR Regulation:    If discussed at Long Length of Stay Meetings, dates discussed:    Comments:  04/14/14 10:40 CM spoke with pt in room for choice.  Pt states he had [facility] in the past.  [facility] for HH[facility]. Pt states he does not need any equipment as he has it from last surgery.  Address and contact numbers were verified with pt.  No other CM needs were communicated. Freddy JakschSarah Teran Knittle, BSN, CM [phone].

## 2014-04-14 NOTE — Progress Notes (Addendum)
   Subjective: 1 Day Post-Op Procedure(s) (LRB): RIGHT TOTAL HIP ARTHROPLASTY ANTERIOR APPROACH (Right) Patient reports pain as mild.   Patient seen in rounds with Dr. Charlann Boxerlin. Patient is well, and has had no acute complaints or problems. No SOB or chest pain. No problems overnight.  Plan is to go Home after hospital stay.  Objective: Vital signs in last 24 hours: Temp:  [97.6 F (36.4 C)-98.2 F (36.8 C)] 98.1 F (36.7 C) (04/29 0602) Pulse Rate:  [72-107] 107 (04/29 0602) Resp:  [14-19] 16 (04/29 0602) BP: (91-145)/(53-92) 131/90 mmHg (04/29 0602) SpO2:  [95 %-100 %] 100 % (04/29 0602) Weight:  [88.906 kg (196 lb)] 88.906 kg (196 lb) (04/28 1039)  Intake/Output from previous day:  Intake/Output Summary (Last 24 hours) at 04/14/14 0742 Last data filed at 04/14/14 0602  Gross per 24 hour  Intake 6501.27 ml  Output   5850 ml  Net 651.27 ml    Intake/Output this shift:    Labs:  Recent Labs  04/14/14 0433  HGB 11.3*    Recent Labs  04/14/14 0433  WBC 12.7*  RBC 4.37  HCT 36.5*  PLT 214    Recent Labs  04/14/14 0433  NA 137  K 3.8  CL 97  CO2 26  BUN 7  CREATININE 0.72  GLUCOSE 153*  CALCIUM 9.7   No results found for this basename: LABPT, INR,  in the last 72 hours  EXAM General - Patient is Alert and Oriented Extremity - Neurologically intact Dorsiflexion/Plantar flexion intact Dressing/Incision - clean, dry, no drainage Motor Function - intact, moving foot and toes well on exam.   Past Medical History  Diagnosis Date  . Hypertension   . GERD (gastroesophageal reflux disease)   . Rib fractures     left side 06/2013   . Depression     NOT ON ANY MEDS NOW - DOING BETTER NOW  . History of kidney stones   . Arthritis     RIGHT HIP AVASCULAR NECROSIS- PAINFUL HIP;  S/P LEFT  HIP REPLACEMENT 07-28-14    Assessment/Plan: 1 Day Post-Op Procedure(s) (LRB): RIGHT TOTAL HIP ARTHROPLASTY ANTERIOR APPROACH (Right) Active Problems:   S/P total hip  arthroplasty  Estimated body mass index is 26.58 kg/(m^2) as calculated from the following:   Height as of this encounter: 6' (1.829 m).   Weight as of this encounter: 88.906 kg (196 lb). Advance diet Up with therapy D/C IV fluids Discharge home with home health if PT goes well  DVT Prophylaxis - Aspirin Weight-Bearing as tolerated   Kerby NoraAmber Lauren Thaddeus Evitts 04/14/2014, 7:42 AM

## 2014-04-14 NOTE — Progress Notes (Signed)
Physical Therapy Treatment Patient Details Name: Raymond [name] MRN: [id number] DOB: 11/24/1966 Today's Date: 04/14/2014    History of Present Illness 48 yo male s/p R THA-direct anterior 4/28.     PT Comments    Progressing well with mobility. Completed all education. Practiced ambulation, exercises, and stair negotiation. No further questions/concerns from pt. Ready to d/c home from PT standpoint.   Follow Up Recommendations  Home health PT     Equipment Recommendations  None recommended by PT    Recommendations for Other Services OT consult     Precautions / Restrictions Precautions Precautions: Fall Restrictions Weight Bearing Restrictions: No RLE Weight Bearing: Weight bearing as tolerated    Mobility  Bed Mobility Overal bed mobility: Needs Assistance Bed Mobility: Sit to Supine     Supine to sit: Modified independent (Device/Increase time) Sit to supine: Min guard   General bed mobility comments: no physical assist needed  Transfers Overall transfer level: Needs assistance Equipment used: Rolling walker (2 wheeled) Transfers: Sit to/from Stand Sit to Stand: Min guard         General transfer comment: close guard for safety  Ambulation/Gait Ambulation/Gait assistance: Min guard Ambulation Distance (Feet): 160 Feet Assistive device: Rolling walker (2 wheeled) Gait Pattern/deviations: Decreased step length - right;Antalgic;Step-through pattern     General Gait Details: close guard for safety   Stairs Stairs: Yes Stairs assistance: Min guard   Number of Stairs: 2 General stair comments: up and over portable steps. VCs safety, technique, sequence. close guard for safety  Wheelchair Mobility    Modified Rankin (Stroke Patients Only)       Balance                                    Cognition Arousal/Alertness: Awake/alert Behavior During Therapy: WFL for tasks assessed/performed Overall Cognitive Status: Within Functional  Limits for tasks assessed                      Exercises Total Joint Exercises Ankle Circles/Pumps: AROM;Both;15 reps;Supine Quad Sets: AROM;Both;15 reps;Supine Heel Slides: AAROM;15 reps;Supine;Right Hip ABduction/ADduction: AAROM;Right;15 reps;Supine    General Comments        Pertinent Vitals/Pain R hip 5/10. Ice applied end of session    Home Living Family/patient expects to be discharged to:: Private residence Living Arrangements: Alone Available Help at Discharge: Family Type of Home: House Home Access: Stairs to enter Entrance Stairs-Rails: Right;Left;Can reach both Home Layout: One level Home Equipment: Crutches;Walker - 2 wheels;Bedside commode;Adaptive equipment Additional Comments: Mother lives next door    Prior Function Level of Independence: Independent with assistive device(s)      Comments: hx of falls with hip giving way   PT Goals (current goals can now be found in the care plan section) Acute Rehab PT Goals Patient Stated Goal: home soon Progress towards PT goals: Progressing toward goals    Frequency  7X/week    PT Plan Current plan remains appropriate    Co-evaluation             End of Session Equipment Utilized During Treatment: Gait belt Activity Tolerance: Patient tolerated treatment well Patient left: in bed;with call bell/phone within reach     Time: 0920-0943 PT Time Calculation (min): 23 min  Charges:  $Gait Training: 8-22 mins $Therapeutic Exercise: 8-22 mins  G Codes:      Weston Anna, MPT Pager: [phone]

## 2014-04-14 NOTE — Progress Notes (Signed)
Discharge summary sent to payer through [facility]  

## 2014-04-17 ENCOUNTER — Inpatient Hospital Stay (HOSPITAL_COMMUNITY)
Admission: EM | Admit: 2014-04-17 | Discharge: 2014-04-21 | DRG: 464 | Disposition: A | Discharge status: Home Health | Payer: BC Managed Care – PPO | Attending: Orthopedic Surgery | Admitting: Orthopedic Surgery

## 2014-04-17 ENCOUNTER — Encounter (HOSPITAL_COMMUNITY): Payer: Self-pay | Admitting: Emergency Medicine

## 2014-04-17 DIAGNOSIS — I10 Essential (primary) hypertension: Secondary | ICD-10-CM | POA: Diagnosis present

## 2014-04-17 DIAGNOSIS — T8149XA Infection following a procedure, other surgical site, initial encounter: Secondary | ICD-10-CM

## 2014-04-17 DIAGNOSIS — Y831 Surgical operation with implant of artificial internal device as the cause of abnormal reaction of the patient, or of later complication, without mention of misadventure at the time of the procedure: Secondary | ICD-10-CM | POA: Diagnosis present

## 2014-04-17 DIAGNOSIS — F329 Major depressive disorder, single episode, unspecified: Secondary | ICD-10-CM | POA: Diagnosis present

## 2014-04-17 DIAGNOSIS — Y849 Medical procedure, unspecified as the cause of abnormal reaction of the patient, or of later complication, without mention of misadventure at the time of the procedure: Secondary | ICD-10-CM

## 2014-04-17 DIAGNOSIS — F3289 Other specified depressive episodes: Secondary | ICD-10-CM | POA: Diagnosis present

## 2014-04-17 DIAGNOSIS — T8459XA Infection and inflammatory reaction due to other internal joint prosthesis, initial encounter: Secondary | ICD-10-CM

## 2014-04-17 DIAGNOSIS — L02419 Cutaneous abscess of limb, unspecified: Secondary | ICD-10-CM | POA: Diagnosis present

## 2014-04-17 DIAGNOSIS — D62 Acute posthemorrhagic anemia: Secondary | ICD-10-CM | POA: Diagnosis not present

## 2014-04-17 DIAGNOSIS — T8140XA Infection following a procedure, unspecified, initial encounter: Secondary | ICD-10-CM

## 2014-04-17 DIAGNOSIS — D500 Iron deficiency anemia secondary to blood loss (chronic): Secondary | ICD-10-CM | POA: Diagnosis present

## 2014-04-17 DIAGNOSIS — K219 Gastro-esophageal reflux disease without esophagitis: Secondary | ICD-10-CM | POA: Diagnosis present

## 2014-04-17 DIAGNOSIS — M87051 Idiopathic aseptic necrosis of right femur: Secondary | ICD-10-CM | POA: Diagnosis present

## 2014-04-17 DIAGNOSIS — Z96642 Presence of left artificial hip joint: Secondary | ICD-10-CM

## 2014-04-17 DIAGNOSIS — Z87442 Personal history of urinary calculi: Secondary | ICD-10-CM

## 2014-04-17 DIAGNOSIS — Z96641 Presence of right artificial hip joint: Secondary | ICD-10-CM

## 2014-04-17 DIAGNOSIS — M87059 Idiopathic aseptic necrosis of unspecified femur: Secondary | ICD-10-CM | POA: Diagnosis present

## 2014-04-17 DIAGNOSIS — M129 Arthropathy, unspecified: Secondary | ICD-10-CM | POA: Diagnosis present

## 2014-04-17 DIAGNOSIS — T8450XA Infection and inflammatory reaction due to unspecified internal joint prosthesis, initial encounter: Principal | ICD-10-CM

## 2014-04-17 DIAGNOSIS — Z87898 Personal history of other specified conditions: Secondary | ICD-10-CM

## 2014-04-17 DIAGNOSIS — L03119 Cellulitis of unspecified part of limb: Secondary | ICD-10-CM

## 2014-04-17 DIAGNOSIS — Z96649 Presence of unspecified artificial hip joint: Secondary | ICD-10-CM

## 2014-04-17 DIAGNOSIS — IMO0002 Reserved for concepts with insufficient information to code with codable children: Secondary | ICD-10-CM | POA: Diagnosis present

## 2014-04-17 DIAGNOSIS — R509 Fever, unspecified: Secondary | ICD-10-CM

## 2014-04-17 DIAGNOSIS — M87052 Idiopathic aseptic necrosis of left femur: Secondary | ICD-10-CM

## 2014-04-17 DIAGNOSIS — E785 Hyperlipidemia, unspecified: Secondary | ICD-10-CM | POA: Diagnosis present

## 2014-04-17 LAB — BASIC METABOLIC PANEL
BUN: 16 mg/dL (ref 6–23)
CALCIUM: 9.2 mg/dL (ref 8.4–10.5)
CO2: 26 mEq/L (ref 19–32)
Chloride: 96 mEq/L (ref 96–112)
Creatinine, Ser: 0.89 mg/dL (ref 0.50–1.35)
GFR calc non Af Amer: >90 mL/min (ref >90)
Glucose, Bld: 103 mg/dL — ABNORMAL HIGH (ref 70–99)
Potassium: 4.1 mEq/L (ref 3.7–5.3)
SODIUM: 135 meq/L — AB (ref 137–147)

## 2014-04-17 LAB — CBC WITH DIFFERENTIAL/PLATELET
BASOS ABS: 0 10*3/uL (ref 0.0–0.1)
BASOS PCT: 0 % (ref 0–1)
EOS PCT: 0 % (ref 0–5)
Eosinophils Absolute: 0 10*3/uL (ref 0.0–0.7)
HEMATOCRIT: 31.5 % — AB (ref 39.0–52.0)
Hemoglobin: 10.2 g/dL — ABNORMAL LOW (ref 13.0–17.0)
Lymphocytes Relative: 29 % (ref 12–46)
Lymphs Abs: 2.4 10*3/uL (ref 0.7–4.0)
MCH: 26.5 pg (ref 26.0–34.0)
MCHC: 32.4 g/dL (ref 30.0–36.0)
MCV: 81.8 fL (ref 78.0–100.0)
MONO ABS: 1.2 10*3/uL — AB (ref 0.1–1.0)
Monocytes Relative: 14 % — ABNORMAL HIGH (ref 3–12)
Neutro Abs: 4.6 10*3/uL (ref 1.7–7.7)
Neutrophils Relative %: 56 % (ref 43–77)
PLATELETS: 188 10*3/uL (ref 150–400)
RBC: 3.85 MIL/uL — ABNORMAL LOW (ref 4.22–5.81)
RDW: 16.7 % — AB (ref 11.5–15.5)
WBC: 8.2 10*3/uL (ref 4.0–10.5)

## 2014-04-17 LAB — I-STAT CG4 LACTIC ACID, ED: LACTIC ACID, VENOUS: 1.15 mmol/L (ref 0.5–2.2)

## 2014-04-17 LAB — URINALYSIS, ROUTINE W REFLEX MICROSCOPIC
Bilirubin Urine: NEGATIVE
Glucose, UA: NEGATIVE mg/dL
Hgb urine dipstick: NEGATIVE
KETONES UR: NEGATIVE mg/dL
Leukocytes, UA: NEGATIVE
NITRITE: NEGATIVE
PH: 6.5 (ref 5.0–8.0)
PROTEIN: NEGATIVE mg/dL
Specific Gravity, Urine: 1.016 (ref 1.005–1.030)
UROBILINOGEN UA: 1 mg/dL (ref 0.0–1.0)

## 2014-04-17 MED ORDER — VANCOMYCIN HCL IN DEXTROSE 1-5 GM/200ML-% IV SOLN
1000.0000 mg | Freq: Three times a day (TID) | INTRAVENOUS | Status: DC
  Administered 2014-04-17 – 2014-04-18 (×3): 1000 mg via INTRAVENOUS
  Filled 2014-04-17 (×6): qty 200

## 2014-04-17 MED ORDER — ONDANSETRON HCL 4 MG/2ML IJ SOLN
4.0000 mg | Freq: Once | INTRAMUSCULAR | Status: AC
  Administered 2014-04-17: 4 mg via INTRAVENOUS
  Filled 2014-04-17: qty 2

## 2014-04-17 MED ORDER — HYDROMORPHONE HCL PF 1 MG/ML IJ SOLN
1.0000 mg | Freq: Once | INTRAMUSCULAR | Status: AC
  Administered 2014-04-17: 1 mg via INTRAVENOUS
  Filled 2014-04-17: qty 1

## 2014-04-17 MED ORDER — SODIUM CHLORIDE 0.9 % IV BOLUS (SEPSIS)
1000.0000 mL | Freq: Once | INTRAVENOUS | Status: AC
Start: 2014-04-17 — End: 2014-04-17
  Administered 2014-04-17: 1000 mL via INTRAVENOUS

## 2014-04-17 MED ORDER — MORPHINE SULFATE 2 MG/ML IJ SOLN
2.0000 mg | INTRAMUSCULAR | Status: DC | PRN
  Administered 2014-04-17: 2 mg via INTRAVENOUS
  Filled 2014-04-17: qty 1

## 2014-04-17 MED ORDER — CEFAZOLIN SODIUM-DEXTROSE 2-3 GM-% IV SOLR
2.0000 g | Freq: Four times a day (QID) | INTRAVENOUS | Status: DC
  Administered 2014-04-17: 2 g via INTRAVENOUS
  Filled 2014-04-17 (×2): qty 50

## 2014-04-17 MED ORDER — CHLORHEXIDINE GLUCONATE 4 % EX LIQD
60.0000 mL | Freq: Once | CUTANEOUS | Status: DC
  Filled 2014-04-17: qty 60

## 2014-04-17 MED ORDER — MORPHINE SULFATE 2 MG/ML IJ SOLN
0.5000 mg | INTRAMUSCULAR | Status: DC | PRN
  Administered 2014-04-17: 0.5 mg via INTRAVENOUS
  Filled 2014-04-17: qty 1

## 2014-04-17 MED ORDER — HYDROMORPHONE HCL PF 1 MG/ML IJ SOLN
1.0000 mg | Freq: Once | INTRAMUSCULAR | Status: AC
  Administered 2014-04-17: 1 mg via INTRAVENOUS

## 2014-04-17 MED ORDER — HYDROMORPHONE HCL PF 1 MG/ML IJ SOLN
1.0000 mg | Freq: Once | INTRAMUSCULAR | Status: DC
  Filled 2014-04-17: qty 1

## 2014-04-17 MED ORDER — ACETAMINOPHEN 325 MG PO TABS: 650.0000 mg | ORAL_TABLET | Freq: Four times a day (QID) | ORAL | Status: DC | PRN

## 2014-04-17 MED ORDER — METHOCARBAMOL 1000 MG/10ML IJ SOLN
500.0000 mg | Freq: Three times a day (TID) | INTRAVENOUS | Status: DC | PRN
  Administered 2014-04-17: 500 mg via INTRAVENOUS
  Filled 2014-04-17: qty 5

## 2014-04-17 MED ORDER — HYDROMORPHONE HCL PF 1 MG/ML IJ SOLN
0.5000 mg | INTRAMUSCULAR | Status: DC | PRN
  Administered 2014-04-17 – 2014-04-21 (×38): 1 mg via INTRAVENOUS
  Filled 2014-04-17 (×38): qty 1

## 2014-04-17 MED ORDER — CHLORHEXIDINE GLUCONATE 4 % EX LIQD: 60.0000 mL | Freq: Once | CUTANEOUS | Status: DC

## 2014-04-17 MED ORDER — HYDROCODONE-ACETAMINOPHEN 5-325 MG PO TABS
1.0000 | ORAL_TABLET | Freq: Four times a day (QID) | ORAL | Status: DC | PRN
  Administered 2014-04-17 – 2014-04-18 (×3): 2 via ORAL
  Filled 2014-04-17 (×4): qty 2

## 2014-04-17 MED ORDER — ACETAMINOPHEN 325 MG PO TABS
650.0000 mg | ORAL_TABLET | Freq: Once | ORAL | Status: AC
  Administered 2014-04-17: 650 mg via ORAL
  Filled 2014-04-17: qty 2

## 2014-04-17 NOTE — ED Notes (Signed)
Pt states that he had rt hip replaced on Tuesday.  Since yesterday has been running high fevers, hip is painful, red, and hot to touch.  Temp of 102.2 in triage.  Denies NVD.  States this happened with the last hip he had replaced.

## 2014-04-17 NOTE — ED Notes (Signed)
Pt reports fever and pain to right hip since yesterday. Pt had right hip surgery Tuesday. Pt reports same issue with left hip replacement August 2014.

## 2014-04-17 NOTE — ED Provider Notes (Signed)
CSN: [id number]     Arrival date & time 04/17/14  0719 History   First MD Initiated Contact with Patient 04/17/14 0730     Chief Complaint  Patient presents with  . Post-op Problem  . Hip Pain     (Consider location/radiation/quality/duration/timing/severity/associated sxs/prior Treatment) HPI Comments: Patient with h/o L hip replacement 07/2013 complicated by post-op hematoma infection and cellulitis -- presents 4 days after R hip replacement performed by Dr. Charlann Boxerlin with increasing pain, fever, redness of thigh. Patient began having increased pain yesterday and was unable to bear weight. No fever, N/V at that time. Worsening pain overnight into this morning not controlled with PO oxycodone. Upon arrival to ED, fever to 102F. Right thigh feels swollen and is 2 inches bigger per patient who measured at home. He is taking Keflex bid. No N/V/D, chest pain, abdominal pain, SOB, urinary symptoms. He has not yet contacted his orthopedist. The onset of this condition was acute. The course is gradually worsening. Aggravating factors: movement, bearing weight. Alleviating factors: none.    The history is provided by the patient and medical records.    Past Medical History  Diagnosis Date  . Hypertension   . GERD (gastroesophageal reflux disease)   . Rib fractures     left side 06/2013   . Depression     NOT ON ANY MEDS NOW - DOING BETTER NOW  . History of kidney stones   . Arthritis     RIGHT HIP AVASCULAR NECROSIS- PAINFUL HIP;  S/P LEFT  HIP REPLACEMENT 07-28-14   Past Surgical History  Procedure Laterality Date  . Kidney stone removal     . Refractive surgery    . Right shoulder surgery     . Nasal sinus surgery    . Total hip arthroplasty Left 07/28/2013    Procedure: LEFT TOTAL HIP ARTHROPLASTY ANTERIOR APPROACH;  Surgeon: Shelda PalMatthew D Olin, MD;  Location: [facility];  Service: Orthopedics;  Laterality: Left;  . Joint replacement    . Hematoma evacuation Left 08/03/2013    Procedure: EVACUATION  HEMATOMA;  Surgeon: Shelda PalMatthew D Olin, MD;  Location: [facility];  Service: Orthopedics;  Laterality: Left;  . Incision and drainage abscess Left 08/03/2013    Procedure: INCISION AND DRAINAGE ABSCESS;  Surgeon: Shelda PalMatthew D Olin, MD;  Location: [facility];  Service: Orthopedics;  Laterality: Left;  . Total hip arthroplasty Right 04/13/2014    Procedure: RIGHT TOTAL HIP ARTHROPLASTY ANTERIOR APPROACH;  Surgeon: Shelda PalMatthew D Olin, MD;  Location: [facility];  Service: Orthopedics;  Laterality: Right;   No family history on file. History  Substance Use Topics  . Smoking status: Never Smoker   . Smokeless tobacco: Current User    Types: Chew     Comment: not ready to quit  . Alcohol Use: 2.5 oz/week    5 drink(s) per week     Comment: occasional beer    Review of Systems  Constitutional: Positive for fever and chills.  HENT: Negative for rhinorrhea and sore throat.   Eyes: Negative for redness.  Respiratory: Negative for cough.   Cardiovascular: Negative for chest pain.  Gastrointestinal: Negative for nausea, vomiting, abdominal pain and diarrhea.  Genitourinary: Negative for dysuria.  Musculoskeletal: Positive for arthralgias and myalgias.  Skin: Positive for color change and rash.  Neurological: Negative for headaches.    Allergies  Other  Home Medications   Prior to Admission medications   Medication Sig Start Date End Date Taking? Authorizing Provider  atorvastatin (LIPITOR) 40 MG  tablet Take 40 mg by mouth every morning.    Historical Provider, MD  cephALEXin (KEFLEX) 500 MG capsule Take 500 mg by mouth 2 (two) times daily.  04/14/14   Historical Provider, MD  docusate sodium 100 MG CAPS Take 100 mg by mouth 2 (two) times daily. 04/13/14   Amber Tamala SerLauren Constable, PA-C  indomethacin (INDOCIN) 50 MG capsule Take 50 mg by mouth 2 (two) times daily with a meal.    Historical Provider, MD  lisinopril (PRINIVIL,ZESTRIL) 40 MG tablet Take 40 mg by mouth every morning.    Historical Provider, MD    methocarbamol (ROBAXIN) 500 MG tablet Take 1 tablet (500 mg total) by mouth every 6 (six) hours as needed for muscle spasms. 04/13/14   Amber Tamala SerLauren Constable, PA-C  naproxen (NAPROSYN) 500 MG tablet Take 500 mg by mouth 2 (two) times daily with a meal.    Historical Provider, MD  omeprazole (PRILOSEC) 40 MG capsule Take 40 mg by mouth daily.    Historical Provider, MD  oxyCODONE (OXY IR/ROXICODONE) 5 MG immediate release tablet Take 1-3 tablets (5-15 mg total) by mouth every 4 (four) hours. 04/13/14   Amber Tamala SerLauren Constable, PA-C  polyethylene glycol (MIRALAX / GLYCOLAX) packet Take 17 g by mouth daily as needed for mild constipation. 04/13/14   Amber Tamala SerLauren Constable, PA-C  verapamil (VERELAN) 100 MG 24 hr capsule Take 100 mg by mouth every morning.     Historical Provider, MD   BP 117/81  Pulse 120  Temp(Src) 102.2 F (39 C) (Oral)  Resp 17  SpO2 100%  Physical Exam  Nursing note and vitals reviewed. Constitutional: He appears well-developed and well-nourished.  Warm to touch  HENT:  Head: Normocephalic and atraumatic.  Eyes: Conjunctivae are normal. Right eye exhibits no discharge. Left eye exhibits no discharge.  Neck: Normal range of motion. Neck supple.  Cardiovascular: Regular rhythm and normal heart sounds.  Tachycardia present.   Pulses:      Dorsalis pedis pulses are 2+ on the right side, and 2+ on the left side.       Posterior tibial pulses are 2+ on the right side, and 2+ on the left side.  Pulmonary/Chest: Effort normal and breath sounds normal. No respiratory distress. He has no wheezes. He has no rales.  Abdominal: Soft. There is no tenderness. There is no rebound and no guarding.  Musculoskeletal:       Right hip: He exhibits decreased range of motion, tenderness and swelling.       Left hip: Normal.       Right knee: Normal.       Lumbar back: Normal.       Right upper leg: He exhibits tenderness and swelling.       Legs: Neurological: He is alert.  Skin: Skin is  warm and dry.  Psychiatric: He has a normal mood and affect.    ED Course  Procedures (including critical care time) Labs Review Labs Reviewed  CBC WITH DIFFERENTIAL - Abnormal; Notable for the following:    RBC 3.85 (*)    Hemoglobin 10.2 (*)    HCT 31.5 (*)    RDW 16.7 (*)    Monocytes Relative 14 (*)    Monocytes Absolute 1.2 (*)    All other components within normal limits  URINALYSIS, ROUTINE W REFLEX MICROSCOPIC  BASIC METABOLIC PANEL  I-STAT CG4 LACTIC ACID, ED    Imaging Review No results found.   EKG Interpretation None  7:44 AM Patient seen and examined. Work-up initiated. Medications ordered.   Vital signs reviewed and are as follows: Filed Vitals:   04/17/14 0726  BP: 117/81  Pulse: 120  Temp: 102.2 F (39 C)  Resp: 17   Patient d/w Dr. Denton Lank. Will ask ortho to see patient/admit.   8:12 AM Spoke with Dr. Rennis Chris who will see in ED. Pending ortho eval.   8:22 AM WBC and lactate look good. Will defer antibiotic decision to orthopedist as not to interfere wit any cultures that may be done.   9:34 AM Patient's pain is currently controlled. To be admitted by ortho.    MDM   Final diagnoses:  Post-operative infection  Fever   Admit for likely post-operative infection/cellulitis in patient with h/o post-operative infected hematoma/cellulitis.     Renne Crigler, PA-C 04/17/14 [phone]

## 2014-04-17 NOTE — H&P (Signed)
Raymond [name]    Chief Complaint: * No surgery found * HPI: The patient is a 48 y.o. male s/p right THA via anterior approach this past Tuesday by Dr. Charlann Boxerlin, did extremely well first 2 days post op requiring no pain meds, now with approximately 24 hrs of increasing right hip and thigh pain, erythema and fevers. Similar episode involving left hip after surgery last fall.  Past Medical History  Diagnosis Date  . Hypertension   . GERD (gastroesophageal reflux disease)   . Rib fractures     left side 06/2013   . Depression     NOT ON ANY MEDS NOW - DOING BETTER NOW  . History of kidney stones   . Arthritis     RIGHT HIP AVASCULAR NECROSIS- PAINFUL HIP;  S/P LEFT  HIP REPLACEMENT 07-28-14    Past Surgical History  Procedure Laterality Date  . Kidney stone removal     . Refractive surgery    . Right shoulder surgery     . Nasal sinus surgery    . Total hip arthroplasty Left 07/28/2013    Procedure: LEFT TOTAL HIP ARTHROPLASTY ANTERIOR APPROACH;  Surgeon: Shelda PalMatthew D Olin, MD;  Location: [facility];  Service: Orthopedics;  Laterality: Left;  . Joint replacement    . Hematoma evacuation Left 08/03/2013    Procedure: EVACUATION HEMATOMA;  Surgeon: Shelda PalMatthew D Olin, MD;  Location: [facility];  Service: Orthopedics;  Laterality: Left;  . Incision and drainage abscess Left 08/03/2013    Procedure: INCISION AND DRAINAGE ABSCESS;  Surgeon: Shelda PalMatthew D Olin, MD;  Location: [facility];  Service: Orthopedics;  Laterality: Left;  . Total hip arthroplasty Right 04/13/2014    Procedure: RIGHT TOTAL HIP ARTHROPLASTY ANTERIOR APPROACH;  Surgeon: Shelda PalMatthew D Olin, MD;  Location: [facility];  Service: Orthopedics;  Laterality: Right;    No family history on file.  Social History:  reports that he has never smoked. His smokeless tobacco use includes Chew. He reports that he drinks about 2.5 ounces of alcohol per week. He reports that he does not use illicit drugs.  Allergies:  Allergies  Allergen Reactions  . Other     PT IS  TAKING CEPHALEXIN 500 MG PO twice A DAY WITHOUT PROBLEM- BUT STATES THAT IF HE TAKES 4 PER DAY AS WAS ORIGINAL PRESCRIPTION - HE WOULD PASS OUT AND FALL - STATES DR. OLIN AWARE.     (Not in a hospital admission)   Physical Exam: right hip with diffuse erythema from groin to knee, generalized swelling and tender to touch. Incision without drainage, N/V intact distally  Vitals  Temp:  [102.2 F (39 C)] 102.2 F (39 C) (05/02 0726) Pulse Rate:  [120] 120 (05/02 0726) Resp:  [17] 17 (05/02 0726) BP: (117)/(81) 117/81 mmHg (05/02 0726) SpO2:  [100 %] 100 % (05/02 0726)  Assessment/Plan  Impression: acute post  op cellulitis with potential deep infection.   Plan of Action: Given similar episode after recent left hip surgery will admit for IV antibiotics as used in previous treatment, obtain ID consult, and Dr. Charlann Boxerlin will be by later to discuss possible surgical I and D.   Vania ReaKevin M Nyelli Samara 04/17/2014, 9:10 AM

## 2014-04-17 NOTE — Progress Notes (Signed)
Patient ID: Raymond [name], male   DOB: 01/05/1966, 48 y.o.   MRN: [id number]         [facility]    Date of Admission:  04/17/2014           On chronic oral cephalexin therapy Principal Problem:   Postoperative wound cellulitis Active Problems:   History of total right hip arthroplasty   History of total left hip arthroplasty   Expected blood loss anemia   Hematoma, postoperative   Prosthetic hip infection   Avascular necrosis of bones of both hips   HTN (hypertension)   GERD (gastroesophageal reflux disease)   Dyslipidemia   H/O syncope   .  ceFAZolin (ANCEF) IV  2 g Intravenous Q6H  . [START ON 04/18/2014] chlorhexidine  60 mL Topical Once    Subjective: Raymond [name] is a 48 year old with a history of avascular necrosis of both hips. Last August he underwent left total hip arthroplasty. In one week of surgery he developed high fever, pain and cellulitis around his incision site. He was readmitted and underwent evacuation of a hematoma. I believe he had been started on IV cefazolin prior to surgery. Operative Gram stain and cultures were negative. He improved postoperatively on cefazolin and was discharged with a PICC. After discharge she was seen on 2 occasions by my partner, Dr. Merceda Elksob Comer, and was doing much better. After 4-5 weeks he was converted to oral cephalexin. He was initially taking it 4 times daily. He states that at the tail end of his IV antibiotic therapy and while he was on cephalexin 4 times daily he had 4-5 episodes where he would pass out and wake up several minutes later on the ground. One episode was witnessed by his physical therapist at his home. He felt there was no correlation between the antibiotic and reduced it to 2 times daily. He has not had any further passing out since that time. He continued on oral cephalexin and recently underwent right total hip arthroplasty on April 28. He resume cephalexin postoperatively. He did extremely well  for the first 48 hours. Yesterday he began to have severe right hip pain, swelling, redness and fevers leading to admission today.  Review of Systems: Constitutional: positive for chills and fevers, negative for sweats and weight loss Eyes: negative Ears, nose, mouth, throat, and face: negative Respiratory: negative Cardiovascular: negative Gastrointestinal: negative Genitourinary:negative Musculoskeletal:positive for acute, severe right hip pain  Past Medical History  Diagnosis Date  . Hypertension   . GERD (gastroesophageal reflux disease)   . Rib fractures     left side 06/2013   . Depression     NOT ON ANY MEDS NOW - DOING BETTER NOW  . History of kidney stones   . Arthritis     RIGHT HIP AVASCULAR NECROSIS- PAINFUL HIP;  S/P LEFT  HIP REPLACEMENT 07-28-14    History  Substance Use Topics  . Smoking status: Never Smoker   . Smokeless tobacco: Current User    Types: Chew     Comment: not ready to quit  . Alcohol Use: 2.5 oz/week    5 drink(s) per week     Comment: occasional beer    History reviewed. No pertinent family history.  Allergies  Allergen Reactions  . Other     PT IS TAKING CEPHALEXIN 500 MG PO twice A DAY WITHOUT PROBLEM- BUT STATES THAT IF HE TAKES 4 PER DAY AS WAS ORIGINAL PRESCRIPTION - HE WOULD PASS OUT AND  FALL - STATES DR. OLIN AWARE.    Objective: Temp:  [98.8 F (37.1 C)-102.2 F (39 C)] 99.1 F (37.3 C) (05/02 1457) Pulse Rate:  [83-120] 96 (05/02 1457) Resp:  [16-17] 16 (05/02 1559) BP: (94-120)/(64-81) 120/75 mmHg (05/02 1457) SpO2:  [95 %-100 %] 98 % (05/02 1559) Weight:  [196 lb (88.905 kg)] 196 lb (88.905 kg) (05/02 1500)  General: He is alert but appears uncomfortable due to pain. He is having mild shaking chills Skin: No generalized rash Lungs: Clear Cor: Regular S1 and S2 with no murmurs Abdomen: Soft and nontender Joints and extremities: Anterior right hip incision is healed without drainage. He has diffuse swelling of his  right hip and thigh with spreading erythema and warmth  Lab Results Lab Results  Component Value Date   WBC 8.2 04/17/2014   HGB 10.2* 04/17/2014   HCT 31.5* 04/17/2014   MCV 81.8 04/17/2014   PLT 188 04/17/2014    Lab Results  Component Value Date   CREATININE 0.89 04/17/2014   BUN 16 04/17/2014   NA 135* 04/17/2014   K 4.1 04/17/2014   CL 96 04/17/2014   CO2 26 04/17/2014    No results found for this basename: ALT, AST, GGT, ALKPHOS, BILITOT      Microbiology: No results found for this or any previous visit (from the past 240 hour(s)).  Studies/Results: No results found.  Assessment: This is a very unusual case of rapid onset of fever and wound cellulitis following the recent hip surgeries. I must assume that this could be early infection. Symptoms began while taking cephalexin so I will place him on IV vancomycin after obtaining blood cultures. The similar nature of his left wound cellulitis and today's his right wound cellulitis and the very rapid postoperative onset raises the possibility of some sort of hypersensitivity reaction. Unfortunately I do not know any way to rule that in or out.  Plan: 1. Obtain blood cultures x 2 2. Start vancomycin after blood cultures 3. Discussed with Dr. Charlann Boxerlin and may consider incision and drainage within the next 48 hours  Raymond AstersJohn Aahan Marques, MD [facility] [phone] pager   [phone] cell 04/17/2014, 4:21 PM

## 2014-04-17 NOTE — Progress Notes (Signed)
ANTIBIOTIC CONSULT NOTE - INITIAL  Pharmacy Consult for vancomycin Indication: wound infection  Allergies  Allergen Reactions  . Other     PT IS TAKING CEPHALEXIN 500 MG PO twice A DAY WITHOUT PROBLEM- BUT STATES THAT IF HE TAKES 4 PER DAY AS WAS ORIGINAL PRESCRIPTION - HE WOULD PASS OUT AND FALL - STATES DR. OLIN AWARE.    Patient Measurements: Height: 6' (182.9 cm) Weight: 196 lb (88.905 kg) IBW/kg (Calculated) : 77.6  Vital Signs: Temp: 99.1 F (37.3 C) (05/02 1457) Temp src: Oral (05/02 1457) BP: 120/75 mmHg (05/02 1457) Pulse Rate: 96 (05/02 1457) Intake/Output from previous day:   Intake/Output from this shift: Total I/O In: -  Out: 300 [Urine:300]  Labs:  Recent Labs  04/17/14 0750  WBC 8.2  HGB 10.2*  PLT 188  CREATININE 0.89   Estimated Creatinine Clearance: 111.4 ml/min (by C-G formula based on Cr of 0.89). No results found for this basename: VANCOTROUGH, Leodis BinetVANCOPEAK, VANCORANDOM, GENTTROUGH, GENTPEAK, GENTRANDOM, TOBRATROUGH, TOBRAPEAK, TOBRARND, AMIKACINPEAK, AMIKACINTROU, AMIKACIN,  in the last 72 hours   Microbiology: Recent Results (from the past 720 hour(s))  SURGICAL PCR SCREEN     Status: None   Collection Time    04/02/14  1:41 PM      Result Value Ref Range Status   MRSA, PCR NEGATIVE  NEGATIVE Final   Staphylococcus aureus NEGATIVE  NEGATIVE Final   Comment:            The Xpert SA Assay (FDA     approved for NASAL specimens     in patients over 49 years of age),     is one component of     a comprehensive surveillance     program.  Test performance has     been validated by [facility] for patients greater     than or equal to 49 year old.     It is not intended     to diagnose infection nor to     guide or monitor treatment.    Medical History: Past Medical History  Diagnosis Date  . Hypertension   . GERD (gastroesophageal reflux disease)   . Rib fractures     left side 06/2013   . Depression     NOT ON ANY MEDS NOW -  DOING BETTER NOW  . History of kidney stones   . Arthritis     RIGHT HIP AVASCULAR NECROSIS- PAINFUL HIP;  S/P LEFT  HIP REPLACEMENT 07-28-14    Medications:  Scheduled:  .  ceFAZolin (ANCEF) IV  2 g Intravenous Q6H  . [START ON 04/18/2014] chlorhexidine  60 mL Topical Once   Infusions:    Assessment: 48 yo male presents with acute post op left wound infection after recent left hip surgery on Keflex PTA but also with right wound cellulitis per ID note. Patient with multiple hip surgeries. Per ID consult, to treat with vancomycin per pharmacy after obtaining blood cultures.   Goal of Therapy:  Vancomycin trough level 15-20 mcg/ml  Plan:  1) Vancomycin 1g IV q8 2) Trough at steady state  Hessie KnowsJustin M Trask Vosler, PharmD, BCPS Pager [phone] 04/17/2014 4:47 PM

## 2014-04-18 LAB — VANCOMYCIN, TROUGH: Vancomycin Tr: 8.1 ug/mL — ABNORMAL LOW (ref 10.0–20.0)

## 2014-04-18 MED ORDER — VANCOMYCIN HCL 10 G IV SOLR
1250.0000 mg | Freq: Three times a day (TID) | INTRAVENOUS | Status: DC
  Administered 2014-04-18 – 2014-04-19 (×3): 1250 mg via INTRAVENOUS
  Filled 2014-04-18 (×6): qty 1250

## 2014-04-18 NOTE — Progress Notes (Signed)
Patient ID: Raymond [name], male   DOB: 04/02/1966, 48 y.o.   MRN: [id number]         [facility]    Date of Admission:  04/17/2014           Day 1 vancomycin  Principal Problem:   Postoperative wound cellulitis Active Problems:   History of total right hip arthroplasty   History of total left hip arthroplasty   Expected blood loss anemia   Hematoma, postoperative   Prosthetic hip infection   Avascular necrosis of bones of both hips   HTN (hypertension)   GERD (gastroesophageal reflux disease)   Dyslipidemia   H/O syncope   . chlorhexidine  60 mL Topical Once  . vancomycin  1,000 mg Intravenous Q8H    Subjective: He is feeling a little bit better.  Objective: Temp:  [98.8 F (37.1 C)-99.9 F (37.7 C)] 99.9 F (37.7 C) (05/03 0645) Pulse Rate:  [83-100] 100 (05/03 0645) Resp:  [16] 16 (05/03 0747) BP: (94-120)/(64-79) 117/79 mmHg (05/03 0645) SpO2:  [93 %-99 %] 93 % (05/03 0747) Weight:  [196 lb (88.905 kg)] 196 lb (88.905 kg) (05/02 1500)  General: He is comfortable in visiting with his wife and a friend Lungs: Clear Cor: Regular S1-S2 no murmurs Abdomen: Nontender Joints extremities: Anterior right hip incision without drainage. Cellulitis around the hip and medial side may be slightly improved  Lab Results Lab Results  Component Value Date   WBC 8.2 04/17/2014   HGB 10.2* 04/17/2014   HCT 31.5* 04/17/2014   MCV 81.8 04/17/2014   PLT 188 04/17/2014    Lab Results  Component Value Date   CREATININE 0.89 04/17/2014   BUN 16 04/17/2014   NA 135* 04/17/2014   K 4.1 04/17/2014   CL 96 04/17/2014   CO2 26 04/17/2014    No results found for this basename: ALT, AST, GGT, ALKPHOS, BILITOT      Microbiology: Blood cultures no growth to date  Assessment: Postoperative wound cellulitis.  Plan: 1. Continue vancomycin  Cliffton AstersJohn Toia Micale, MD [facility] [phone] pager   [phone] cell 04/18/2014, 10:03  AM

## 2014-04-18 NOTE — Progress Notes (Signed)
Subjective: Patient seen and examined this am.  No complaints. No N/V or F/C. Denies SOb, CP, or calf pain.  Objective: Vital signs in last 24 hours: Temp:  [98.8 F (37.1 C)-99.9 F (37.7 C)] 99.9 F (37.7 C) (05/03 0645) Pulse Rate:  [83-100] 100 (05/03 0645) Resp:  [16] 16 (05/03 0747) BP: (94-120)/(64-79) 117/79 mmHg (05/03 0645) SpO2:  [93 %-99 %] 93 % (05/03 0747) Weight:  [88.905 kg (196 lb)] 88.905 kg (196 lb) (05/02 1500)  Intake/Output from previous day: 05/02 0701 - 05/03 0700 In: -  Out: 2200 [Urine:2200] Intake/Output this shift:     Recent Labs  04/17/14 0750  HGB 10.2*    Recent Labs  04/17/14 0750  WBC 8.2  RBC 3.85*  HCT 31.5*  PLT 188    Recent Labs  04/17/14 0750  NA 135*  K 4.1  CL 96  CO2 26  BUN 16  CREATININE 0.89  GLUCOSE 103*  CALCIUM 9.2   No results found for this basename: LABPT, INR,  in the last 72 hours  A&O x3. Right Hip incision well approximated. Mild erythema. No Drainage.  Tender to touch. RLE N/V  Assessment/Plan: Post-op right hip cellulitis:  NPO after MN today. Plan for I&D tomorrow by Dr.Olin. Continue IV ABX per ID. Continue other current care.   Raymond [name] 04/18/2014, 8:17 AM

## 2014-04-18 NOTE — ED Provider Notes (Signed)
Medical screening examination/treatment/procedure(s) were conducted as a shared visit with non-physician practitioner(s) and myself.  I personally evaluated the patient during the encounter.   EKG Interpretation None      Pt c/o erythema, swelling, to right hip surgical site, w fevers 102.  No crepitus. Cellulitis of skin at/around surgical site. Distal pulses palp. Labs. Iv abx. Admit.   Suzi RootsKevin E Cray Monnin, MD 04/18/14 [phone]

## 2014-04-18 NOTE — Progress Notes (Signed)
ANTIBIOTIC CONSULT NOTE - Follow up  Pharmacy Consult for vancomycin Indication: wound infection  Allergies  Allergen Reactions  . Other     PT IS TAKING CEPHALEXIN 500 MG PO twice A DAY WITHOUT PROBLEM- BUT STATES THAT IF HE TAKES 4 PER DAY AS WAS ORIGINAL PRESCRIPTION - HE WOULD PASS OUT AND FALL - STATES DR. OLIN AWARE.    Patient Measurements: Height: 6' (182.9 cm) Weight: 196 lb (88.905 kg) IBW/kg (Calculated) : 77.6  Vital Signs: Temp: 100.2 F (37.9 C) (05/03 1325) Temp src: Oral (05/03 1325) BP: 126/76 mmHg (05/03 1325) Pulse Rate: 101 (05/03 1325) Intake/Output from previous day: 05/02 0701 - 05/03 0700 In: -  Out: 2200 [Urine:2200] Intake/Output from this shift: Total I/O In: 1135 [P.O.:480; IV Piggyback:655] Out: 650 [Urine:650]  Labs:  Recent Labs  04/17/14 0750  WBC 8.2  HGB 10.2*  PLT 188  CREATININE 0.89   Estimated Creatinine Clearance: 111.4 ml/min (by C-G formula based on Cr of 0.89).  Recent Labs  04/18/14 1705  VANCOTROUGH 8.1*     Microbiology: Recent Results (from the past 720 hour(s))  SURGICAL PCR SCREEN     Status: None   Collection Time    04/02/14  1:41 PM      Result Value Ref Range Status   MRSA, PCR NEGATIVE  NEGATIVE Final   Staphylococcus aureus NEGATIVE  NEGATIVE Final   Comment:            The Xpert SA Assay (FDA     approved for NASAL specimens     in patients over 49 years of age),     is one component of     a comprehensive surveillance     program.  Test performance has     been validated by [facility] for patients greater     than or equal to 49 year old.     It is not intended     to diagnose infection nor to     guide or monitor treatment.   Assessment: 48 yo male presents with acute post op left wound infection after recent left hip surgery on Keflex PTA but also with right wound cellulitis per ID note. Patient with multiple hip surgeries. Per ID consult, to treat with vancomycin per pharmacy after  obtaining blood cultures.   Day 2 Vanc 1g IV q8h.  SCr wnl, CrCl >100  Blood cultures in process.  Vanc trough 8.1 drawn ~7 hours after previous dose.  Goal of Therapy:  Vancomycin trough level 15-20 mcg/ml  Plan:   Increase Vanc to 1250mg  IV q8h.  Recheck trough at steady state. Follow up renal fxn and culture results.  Charolotte Ekeom Quran Vasco, PharmD, pager [phone]. 04/18/2014,5:57 PM.

## 2014-04-19 ENCOUNTER — Encounter (HOSPITAL_COMMUNITY)
Admission: EM | Disposition: A | Discharge status: Home Health | Payer: Self-pay | Source: Home / Self Care | Attending: Orthopedic Surgery

## 2014-04-19 ENCOUNTER — Encounter (HOSPITAL_COMMUNITY): Payer: BC Managed Care – PPO | Admitting: Registered Nurse

## 2014-04-19 ENCOUNTER — Encounter (HOSPITAL_COMMUNITY): Payer: Self-pay | Admitting: Registered Nurse

## 2014-04-19 ENCOUNTER — Inpatient Hospital Stay (HOSPITAL_COMMUNITY): Payer: BC Managed Care – PPO | Admitting: Registered Nurse

## 2014-04-19 DIAGNOSIS — L0390 Cellulitis, unspecified: Secondary | ICD-10-CM

## 2014-04-19 DIAGNOSIS — L0291 Cutaneous abscess, unspecified: Secondary | ICD-10-CM

## 2014-04-19 DIAGNOSIS — Z96649 Presence of unspecified artificial hip joint: Secondary | ICD-10-CM

## 2014-04-19 HISTORY — PX: INCISION AND DRAINAGE HIP: SHX1801

## 2014-04-19 LAB — CBC
HCT: 32.8 % — ABNORMAL LOW (ref 39.0–52.0)
Hemoglobin: 10.1 g/dL — ABNORMAL LOW (ref 13.0–17.0)
MCH: 25.6 pg — ABNORMAL LOW (ref 26.0–34.0)
MCHC: 30.8 g/dL (ref 30.0–36.0)
MCV: 83.2 fL (ref 78.0–100.0)
PLATELETS: 220 10*3/uL (ref 150–400)
RBC: 3.94 MIL/uL — ABNORMAL LOW (ref 4.22–5.81)
RDW: 16.4 % — AB (ref 11.5–15.5)
WBC: 8.4 10*3/uL (ref 4.0–10.5)

## 2014-04-19 LAB — BASIC METABOLIC PANEL
BUN: 8 mg/dL (ref 6–23)
CO2: 28 mEq/L (ref 19–32)
Calcium: 9.1 mg/dL (ref 8.4–10.5)
Chloride: 94 mEq/L — ABNORMAL LOW (ref 96–112)
Creatinine, Ser: 0.82 mg/dL (ref 0.50–1.35)
GFR calc Af Amer: >90 mL/min (ref >90)
Glucose, Bld: 102 mg/dL — ABNORMAL HIGH (ref 70–99)
Potassium: 3.9 mEq/L (ref 3.7–5.3)
Sodium: 134 mEq/L — ABNORMAL LOW (ref 137–147)

## 2014-04-19 LAB — SURGICAL PCR SCREEN
MRSA, PCR: NEGATIVE
Staphylococcus aureus: NEGATIVE

## 2014-04-19 SURGERY — IRRIGATION AND DEBRIDEMENT HIP
Anesthesia: General | Site: Hip | Laterality: Right

## 2014-04-19 MED ORDER — MIDAZOLAM HCL 2 MG/2ML IJ SOLN
INTRAMUSCULAR | Status: AC
  Filled 2014-04-19: qty 2

## 2014-04-19 MED ORDER — SUCCINYLCHOLINE CHLORIDE 20 MG/ML IJ SOLN
INTRAMUSCULAR | Status: DC | PRN
  Administered 2014-04-19: 100 mg via INTRAVENOUS

## 2014-04-19 MED ORDER — LACTATED RINGERS IV SOLN: INTRAVENOUS | Status: DC

## 2014-04-19 MED ORDER — SODIUM CHLORIDE 0.9 % IV SOLN
INTRAVENOUS | Status: DC
  Administered 2014-04-19 – 2014-04-21 (×3): via INTRAVENOUS
  Filled 2014-04-19 (×6): qty 1000

## 2014-04-19 MED ORDER — FENTANYL CITRATE 0.05 MG/ML IJ SOLN
INTRAMUSCULAR | Status: AC
  Filled 2014-04-19: qty 2

## 2014-04-19 MED ORDER — FENTANYL CITRATE 0.05 MG/ML IJ SOLN
25.0000 ug | INTRAMUSCULAR | Status: DC | PRN
  Administered 2014-04-19: 50 ug via INTRAVENOUS

## 2014-04-19 MED ORDER — SODIUM CHLORIDE 0.9 % IR SOLN
Status: DC | PRN
  Administered 2014-04-19: 6000 mL

## 2014-04-19 MED ORDER — ASPIRIN EC 325 MG PO TBEC
325.0000 mg | DELAYED_RELEASE_TABLET | Freq: Two times a day (BID) | ORAL | Status: DC
  Administered 2014-04-20 – 2014-04-21 (×3): 325 mg via ORAL
  Filled 2014-04-19 (×5): qty 1

## 2014-04-19 MED ORDER — GLYCOPYRROLATE 0.2 MG/ML IJ SOLN
INTRAMUSCULAR | Status: AC
  Filled 2014-04-19: qty 2

## 2014-04-19 MED ORDER — MENTHOL 3 MG MT LOZG
1.0000 | LOZENGE | OROMUCOSAL | Status: DC | PRN
  Filled 2014-04-19: qty 9

## 2014-04-19 MED ORDER — POLYETHYLENE GLYCOL 3350 17 G PO PACK
17.0000 g | PACK | Freq: Every day | ORAL | Status: DC | PRN
  Filled 2014-04-19: qty 1

## 2014-04-19 MED ORDER — MIDAZOLAM HCL 5 MG/5ML IJ SOLN
INTRAMUSCULAR | Status: DC | PRN
  Administered 2014-04-19: 2 mg via INTRAVENOUS

## 2014-04-19 MED ORDER — 0.9 % SODIUM CHLORIDE (POUR BTL) OPTIME
TOPICAL | Status: DC | PRN
  Administered 2014-04-19: 1000 mL

## 2014-04-19 MED ORDER — METHOCARBAMOL 500 MG PO TABS: 500.0000 mg | ORAL_TABLET | Freq: Four times a day (QID) | ORAL | Status: DC | PRN

## 2014-04-19 MED ORDER — PROPOFOL 10 MG/ML IV BOLUS
INTRAVENOUS | Status: AC
  Filled 2014-04-19: qty 20

## 2014-04-19 MED ORDER — PHENOL 1.4 % MT LIQD
1.0000 | OROMUCOSAL | Status: DC | PRN
  Administered 2014-04-19: 1 via OROMUCOSAL
  Filled 2014-04-19: qty 177

## 2014-04-19 MED ORDER — ONDANSETRON HCL 4 MG/2ML IJ SOLN
INTRAMUSCULAR | Status: DC | PRN
  Administered 2014-04-19: 4 mg via INTRAVENOUS

## 2014-04-19 MED ORDER — ONDANSETRON HCL 4 MG PO TABS: 4.0000 mg | ORAL_TABLET | Freq: Four times a day (QID) | ORAL | Status: DC | PRN

## 2014-04-19 MED ORDER — LIDOCAINE HCL (CARDIAC) 20 MG/ML IV SOLN
INTRAVENOUS | Status: AC
  Filled 2014-04-19: qty 5

## 2014-04-19 MED ORDER — FENTANYL CITRATE 0.05 MG/ML IJ SOLN
INTRAMUSCULAR | Status: AC
  Filled 2014-04-19: qty 5

## 2014-04-19 MED ORDER — ONDANSETRON HCL 4 MG/2ML IJ SOLN
INTRAMUSCULAR | Status: AC
  Filled 2014-04-19: qty 2

## 2014-04-19 MED ORDER — SENNA 8.6 MG PO TABS
1.0000 | ORAL_TABLET | Freq: Two times a day (BID) | ORAL | Status: DC
  Administered 2014-04-19 – 2014-04-21 (×4): 8.6 mg via ORAL
  Filled 2014-04-19 (×4): qty 1

## 2014-04-19 MED ORDER — HYDROMORPHONE HCL PF 1 MG/ML IJ SOLN
0.2500 mg | INTRAMUSCULAR | Status: DC | PRN
  Administered 2014-04-19 (×4): 0.5 mg via INTRAVENOUS

## 2014-04-19 MED ORDER — FENTANYL CITRATE 0.05 MG/ML IJ SOLN
INTRAMUSCULAR | Status: DC | PRN
  Administered 2014-04-19 (×3): 50 ug via INTRAVENOUS

## 2014-04-19 MED ORDER — FERROUS SULFATE 325 (65 FE) MG PO TABS
325.0000 mg | ORAL_TABLET | Freq: Three times a day (TID) | ORAL | Status: DC
  Administered 2014-04-20 – 2014-04-21 (×5): 325 mg via ORAL
  Filled 2014-04-19 (×7): qty 1

## 2014-04-19 MED ORDER — NEOSTIGMINE METHYLSULFATE 10 MG/10ML IV SOLN
INTRAVENOUS | Status: DC | PRN
  Administered 2014-04-19: 3 mg via INTRAVENOUS

## 2014-04-19 MED ORDER — DOCUSATE SODIUM 100 MG PO CAPS
100.0000 mg | ORAL_CAPSULE | Freq: Two times a day (BID) | ORAL | Status: DC
  Administered 2014-04-19 – 2014-04-21 (×4): 100 mg via ORAL
  Filled 2014-04-19 (×5): qty 1

## 2014-04-19 MED ORDER — HYDROMORPHONE HCL PF 1 MG/ML IJ SOLN
INTRAMUSCULAR | Status: AC
  Filled 2014-04-19: qty 1

## 2014-04-19 MED ORDER — ONDANSETRON HCL 4 MG/2ML IJ SOLN: 4.0000 mg | Freq: Four times a day (QID) | INTRAMUSCULAR | Status: DC | PRN

## 2014-04-19 MED ORDER — PROPOFOL 10 MG/ML IV BOLUS
INTRAVENOUS | Status: DC | PRN
  Administered 2014-04-19: 200 mg via INTRAVENOUS

## 2014-04-19 MED ORDER — GLYCOPYRROLATE 0.2 MG/ML IJ SOLN
INTRAMUSCULAR | Status: DC | PRN
  Administered 2014-04-19: .4 mg via INTRAVENOUS

## 2014-04-19 MED ORDER — HYDROCODONE-ACETAMINOPHEN 5-325 MG PO TABS
1.0000 | ORAL_TABLET | Freq: Four times a day (QID) | ORAL | Status: DC
  Administered 2014-04-20 – 2014-04-21 (×3): 2 via ORAL
  Filled 2014-04-19 (×3): qty 2

## 2014-04-19 MED ORDER — LACTATED RINGERS IV SOLN
INTRAVENOUS | Status: DC
  Administered 2014-04-19: 1000 mL via INTRAVENOUS

## 2014-04-19 MED ORDER — ROCURONIUM BROMIDE 100 MG/10ML IV SOLN
INTRAVENOUS | Status: DC | PRN
  Administered 2014-04-19: 10 mg via INTRAVENOUS

## 2014-04-19 MED ORDER — LIDOCAINE HCL (CARDIAC) 20 MG/ML IV SOLN
INTRAVENOUS | Status: DC | PRN
  Administered 2014-04-19: 100 mg via INTRAVENOUS

## 2014-04-19 MED ORDER — DIPHENHYDRAMINE HCL 12.5 MG/5ML PO ELIX: 25.0000 mg | ORAL_SOLUTION | Freq: Four times a day (QID) | ORAL | Status: DC | PRN

## 2014-04-19 MED ORDER — DEXTROSE 5 % IV SOLN
500.0000 mg | Freq: Four times a day (QID) | INTRAVENOUS | Status: DC | PRN
  Administered 2014-04-19: 500 mg via INTRAVENOUS
  Filled 2014-04-19: qty 5

## 2014-04-19 MED ORDER — SODIUM CHLORIDE 0.9 % IJ SOLN
10.0000 mL | INTRAMUSCULAR | Status: DC | PRN
  Administered 2014-04-20: 20 mL
  Administered 2014-04-21: 10 mL

## 2014-04-19 MED ORDER — ALUM & MAG HYDROXIDE-SIMETH 200-200-20 MG/5ML PO SUSP
30.0000 mL | ORAL | Status: DC | PRN
  Administered 2014-04-19: 30 mL via ORAL
  Filled 2014-04-19: qty 30

## 2014-04-19 SURGICAL SUPPLY — 42 items
BAG ZIPLOCK 12X15 (MISCELLANEOUS) ×3 IMPLANT
DERMABOND ADVANCED (GAUZE/BANDAGES/DRESSINGS) ×2
DERMABOND ADVANCED .7 DNX12 (GAUZE/BANDAGES/DRESSINGS) ×1 IMPLANT
DRAPE INCISE IOBAN 85X60 (DRAPES) ×3 IMPLANT
DRAPE ORTHO SPLIT 77X108 STRL (DRAPES) ×4
DRAPE SURG 17X11 SM STRL (DRAPES) ×3 IMPLANT
DRAPE SURG ORHT 6 SPLT 77X108 (DRAPES) ×2 IMPLANT
DRAPE U-SHAPE 47X51 STRL (DRAPES) ×6 IMPLANT
DRSG AQUACEL AG ADV 3.5X 6 (GAUZE/BANDAGES/DRESSINGS) ×3 IMPLANT
DRSG AQUACEL AG ADV 3.5X10 (GAUZE/BANDAGES/DRESSINGS) IMPLANT
DRSG TEGADERM 4X4.75 (GAUZE/BANDAGES/DRESSINGS) ×3 IMPLANT
DURAPREP 26ML APPLICATOR (WOUND CARE) ×3 IMPLANT
ELECT REM PT RETURN 9FT ADLT (ELECTROSURGICAL) ×3
ELECTRODE REM PT RTRN 9FT ADLT (ELECTROSURGICAL) ×1 IMPLANT
EVACUATOR 1/8 PVC DRAIN (DRAIN) ×3 IMPLANT
GAUZE SPONGE 2X2 8PLY STRL LF (GAUZE/BANDAGES/DRESSINGS) ×1 IMPLANT
GAUZE XEROFORM 5X9 LF (GAUZE/BANDAGES/DRESSINGS) IMPLANT
GLOVE BIOGEL PI IND STRL 7.5 (GLOVE) ×1 IMPLANT
GLOVE BIOGEL PI IND STRL 8 (GLOVE) ×1 IMPLANT
GLOVE BIOGEL PI INDICATOR 7.5 (GLOVE) ×2
GLOVE BIOGEL PI INDICATOR 8 (GLOVE) ×2
GLOVE ECLIPSE 8.0 STRL XLNG CF (GLOVE) IMPLANT
GLOVE ORTHO TXT STRL SZ7.5 (GLOVE) ×6 IMPLANT
GLOVE SURG ORTHO 8.0 STRL STRW (GLOVE) ×3 IMPLANT
GOWN SPEC L3 XXLG W/TWL (GOWN DISPOSABLE) ×6 IMPLANT
GOWN STRL REUS W/TWL LRG LVL3 (GOWN DISPOSABLE) ×3 IMPLANT
HANDPIECE INTERPULSE COAX TIP (DISPOSABLE) ×2
IV NS IRRIG 3000ML ARTHROMATIC (IV SOLUTION) ×3 IMPLANT
KIT BASIN OR (CUSTOM PROCEDURE TRAY) ×3 IMPLANT
MANIFOLD NEPTUNE II (INSTRUMENTS) ×3 IMPLANT
PACK TOTAL JOINT (CUSTOM PROCEDURE TRAY) ×3 IMPLANT
POSITIONER SURGICAL ARM (MISCELLANEOUS) ×3 IMPLANT
SET HNDPC FAN SPRY TIP SCT (DISPOSABLE) ×1 IMPLANT
SPONGE GAUZE 2X2 STER 10/PKG (GAUZE/BANDAGES/DRESSINGS) ×2
STAPLER VISISTAT 35W (STAPLE) IMPLANT
SUT MNCRL AB 4-0 PS2 18 (SUTURE) ×3 IMPLANT
SUT VIC AB 1 CT1 36 (SUTURE) ×6 IMPLANT
SUT VIC AB 2-0 CT1 27 (SUTURE) ×4
SUT VIC AB 2-0 CT1 TAPERPNT 27 (SUTURE) ×2 IMPLANT
SWAB COLLECTION DEVICE MRSA (MISCELLANEOUS) ×3 IMPLANT
TOWEL OR 17X26 10 PK STRL BLUE (TOWEL DISPOSABLE) ×6 IMPLANT
TUBE ANAEROBIC SPECIMEN COL (MISCELLANEOUS) ×3 IMPLANT

## 2014-04-19 NOTE — Progress Notes (Signed)
Peripherally Inserted Central Catheter/Midline Placement  The IV Nurse has discussed with the patient and/or persons authorized to consent for the patient, the purpose of this procedure and the potential benefits and risks involved with this procedure.  The benefits include less needle sticks, lab draws from the catheter and patient may be discharged home with the catheter.  Risks include, but not limited to, infection, bleeding, blood clot (thrombus formation), and puncture of an artery; nerve damage and irregular heat beat.  Alternatives to this procedure were also discussed.  PICC/Midline Placement Documentation  PICC / Midline Single Lumen 08/04/13 PICC Right Basilic (Active)       Raymond [name] 04/19/2014, 12:14 PM Consent obtained by Lazarus Gowdaenee Newman, RN

## 2014-04-19 NOTE — Anesthesia Postprocedure Evaluation (Signed)
  Anesthesia Post-op Note  Patient: Raymond [name]  Procedure(s) Performed: Procedure(s) (LRB): INCISION AND DRAINAGE - ON HANA TABLE (Right)  Patient Location: PACU  Anesthesia Type: General  Level of Consciousness: awake and alert   Airway and Oxygen Therapy: Patient Spontanous Breathing  Post-op Pain: mild  Post-op Assessment: Post-op Vital signs reviewed, Patient's Cardiovascular Status Stable, Respiratory Function Stable, Patent Airway and No signs of Nausea or vomiting  Last Vitals:  Filed Vitals:   04/19/14 1830  BP: 133/76  Pulse: 96  Temp: 36.8 C  Resp: 14    Post-op Vital Signs: stable   Complications: No apparent anesthesia complications

## 2014-04-19 NOTE — Brief Op Note (Signed)
04/17/2014 - 04/19/2014  6:55 PM  PATIENT:  Raymond [name]  48 y.o. male  PRE-OPERATIVE DIAGNOSIS:  Acute superficial right hip infection  POST-OPERATIVE DIAGNOSIS:  Acute superficial right hip infection  PROCEDURE:  Procedure(s): INCISION AND DRAINAGE, non-excisional debridement of right hip wound, 4-5 inches with pulse lavage  SURGEON:  Surgeon(s) and Role:    * Shelda PalMatthew D Lailany Enoch, MD - Primary  PHYSICIAN ASSISTANT: Leilani AbleSteve Chabon, PA-C  ANESTHESIA:   general  EBL:  Total I/O In: 800 [I.V.:800] Out: 215 [Urine:200; Blood:15]  BLOOD ADMINISTERED:none  DRAINS: (1 medium) Hemovact drain(s) in the right hip with  Suction Open   LOCAL MEDICATIONS USED:  NONE  SPECIMEN:  Source of Specimen:  right hip superficial wound  DISPOSITION OF SPECIMEN:  PATHOLOGY  COUNTS:  YES  TOURNIQUET:  * No tourniquets in log *  DICTATION: .Other Dictation: Dictation Number [id number]  PLAN OF CARE: Admit to inpatient   PATIENT DISPOSITION:  PACU - hemodynamically stable.   Delay start of Pharmacological VTE agent (>24hrs) due to surgical blood loss or risk of bleeding: no

## 2014-04-19 NOTE — H&P (View-Only) (Signed)
Patient ID: Raymond [name], male   DOB: 04/06/1966, 48 y.o.   MRN: [id number]  Right thigh peri-incisional cellulitis, post op Somewhat improved BUT  Based on random similar experience with his left THR we feel that it is his best interest to go to OR for superficial I&D Will plan on 4-6 weeks IV antibiotics, same as last time  Consent ordered NPO

## 2014-04-19 NOTE — Interval H&P Note (Signed)
History and Physical Interval Note:  04/19/2014 4:23 PM  Raymond [name]  has presented today for surgery, with the diagnosis of infected right hip  The various methods of treatment have been discussed with the patient and family. After consideration of risks, benefits and other options for treatment, the patient has consented to  Procedure(s): INCISION AND DRAINAGE - ON HANA TABLE (Right) as a surgical intervention .  The patient's history has been reviewed, patient examined, no change in status, stable for surgery.  I have reviewed the patient's chart and labs.  Questions were answered to the patient's satisfaction.     Shelda PalMatthew D Larrissa Stivers

## 2014-04-19 NOTE — Transfer of Care (Signed)
Immediate Anesthesia Transfer of Care Note  Patient: Raymond [name]  Procedure(s) Performed: Procedure(s): INCISION AND DRAINAGE - ON HANA TABLE (Right)  Patient Location: PACU  Anesthesia Type:General  Level of Consciousness: awake, alert , oriented and patient cooperative  Airway & Oxygen Therapy: Patient Spontanous Breathing and Patient connected to face mask oxygen  Post-op Assessment: Report given to PACU RN, Post -op Vital signs reviewed and stable and Patient moving all extremities  Post vital signs: Reviewed and stable  Complications: No apparent anesthesia complications

## 2014-04-19 NOTE — Progress Notes (Signed)
    [facility]  Date of Admission:  04/17/2014  Antibiotics: Vancomycin day 3  Subjective: Currently getting picc line placed  Objective: Temp:  [99.4 F (37.4 C)-100.2 F (37.9 C)] 99.7 F (37.6 C) (05/04 0900) Pulse Rate:  [91-101] 91 (05/04 0900) Resp:  [16-18] 16 (05/04 0900) BP: (109-136)/(72-80) 109/72 mmHg (05/04 0900) SpO2:  [97 %-100 %] 97 % (05/04 0900)  General: getting picc placed   Lab Results Lab Results  Component Value Date   WBC 8.4 04/19/2014   HGB 10.1* 04/19/2014   HCT 32.8* 04/19/2014   MCV 83.2 04/19/2014   PLT 220 04/19/2014    Lab Results  Component Value Date   CREATININE 0.82 04/19/2014   BUN 8 04/19/2014   NA 134* 04/19/2014   K 3.9 04/19/2014   CL 94* 04/19/2014   CO2 28 04/19/2014    No results found for this basename: ALT, AST, GGT, ALKPHOS, BILITOT      Microbiology: Recent Results (from the past 240 hour(s))  CULTURE, BLOOD (ROUTINE X 2)     Status: None   Collection Time    04/17/14  4:50 PM      Result Value Ref Range Status   Specimen Description BLOOD LEFT ARM   Final   Special Requests     Final   Value: BOTTLES DRAWN AEROBIC AND ANAEROBIC  10CC BOTH BOTTLES   Culture  Setup Time     Final   Value: 04/17/2014 21:57     Performed at [facility]   Culture     Final   Value:        BLOOD CULTURE RECEIVED NO GROWTH TO DATE CULTURE WILL BE HELD FOR 5 DAYS BEFORE ISSUING A FINAL NEGATIVE REPORT     Performed at [facility]   Report Status PENDING   Incomplete  CULTURE, BLOOD (ROUTINE X 2)     Status: None   Collection Time    04/17/14  5:00 PM      Result Value Ref Range Status   Specimen Description BLOOD LEFT ARM   Final   Special Requests BOTTLES DRAWN AEROBIC ONLY 4CC   Final   Culture  Setup Time     Final   Value: 04/17/2014 21:57     Performed at [facility]   Culture     Final   Value:        BLOOD CULTURE RECEIVED NO GROWTH TO DATE CULTURE WILL BE HELD FOR 5 DAYS BEFORE ISSUING  A FINAL NEGATIVE REPORT     Performed at [facility]   Report Status PENDING   Incomplete    Studies/Results: No results found.  Assessment/Plan: 1) cellulitis - to go back to OR for I and D.  Unusual to have occurred again.  Will continue with vancomycin for now and await cultures.  Will consider switch to IV ceftaroline for ease of dosing/monitoring at home with same MRSA coverage, depending on cultures.    Gardiner Barefootobert W Comer, MD [facility] [email or web address] [phone] pager   [phone] cell 04/19/2014, 12:08 PM

## 2014-04-19 NOTE — Progress Notes (Signed)
Patient ID: Raymond [name], male   DOB: 10/12/1966, 47 y.o.   MRN: [id number]  Right thigh peri-incisional cellulitis, post op Somewhat improved BUT  Based on random similar experience with his left THR we feel that it is his best interest to go to OR for superficial I&D Will plan on 4-6 weeks IV antibiotics, same as last time  Consent ordered NPO 

## 2014-04-19 NOTE — Anesthesia Preprocedure Evaluation (Addendum)
Anesthesia Evaluation  Patient identified by MRN, date of birth, ID band Patient awake    Reviewed: Allergy & Precautions, H&P , NPO status , Patient's Chart, lab work & pertinent test results  Airway Mallampati: II TM Distance: >3 FB Neck ROM: Full    Dental  (+) Dental Advisory Given, Edentulous Upper, Edentulous Lower   Pulmonary neg pulmonary ROS,  breath sounds clear to auscultation  Pulmonary exam normal       Cardiovascular hypertension, Pt. on medications Rhythm:Regular Rate:Normal     Neuro/Psych negative neurological ROS  negative psych ROS   GI/Hepatic negative GI ROS, Neg liver ROS,   Endo/Other  negative endocrine ROS  Renal/GU negative Renal ROS  negative genitourinary   Musculoskeletal negative musculoskeletal ROS (+)   Abdominal   Peds negative pediatric ROS (+)  Hematology negative hematology ROS (+) anemia , hgb 10.1   Anesthesia Other Findings   Reproductive/Obstetrics negative OB ROS                        Anesthesia Physical Anesthesia Plan  ASA: II  Anesthesia Plan: General   Post-op Pain Management:    Induction: Intravenous  Airway Management Planned: Oral ETT  Additional Equipment:   Intra-op Plan:   Post-operative Plan: Extubation in OR  Informed Consent:   Plan Discussed with: Surgeon  Anesthesia Plan Comments:         Anesthesia Quick Evaluation

## 2014-04-20 ENCOUNTER — Encounter (HOSPITAL_COMMUNITY): Payer: Self-pay | Admitting: Orthopedic Surgery

## 2014-04-20 LAB — CBC
HCT: 28.8 % — ABNORMAL LOW (ref 39.0–52.0)
HEMOGLOBIN: 9.2 g/dL — AB (ref 13.0–17.0)
MCH: 26.2 pg (ref 26.0–34.0)
MCHC: 31.9 g/dL (ref 30.0–36.0)
MCV: 82.1 fL (ref 78.0–100.0)
Platelets: 257 10*3/uL (ref 150–400)
RBC: 3.51 MIL/uL — ABNORMAL LOW (ref 4.22–5.81)
RDW: 16.2 % — ABNORMAL HIGH (ref 11.5–15.5)
WBC: 7 10*3/uL (ref 4.0–10.5)

## 2014-04-20 LAB — BASIC METABOLIC PANEL
BUN: 9 mg/dL (ref 6–23)
CALCIUM: 8.8 mg/dL (ref 8.4–10.5)
CO2: 28 meq/L (ref 19–32)
Chloride: 94 mEq/L — ABNORMAL LOW (ref 96–112)
Creatinine, Ser: 0.72 mg/dL (ref 0.50–1.35)
GFR calc Af Amer: >90 mL/min (ref >90)
GLUCOSE: 102 mg/dL — AB (ref 70–99)
Potassium: 4 mEq/L (ref 3.7–5.3)
SODIUM: 134 meq/L — AB (ref 137–147)

## 2014-04-20 MED ORDER — SODIUM CHLORIDE 0.9 % IV SOLN
600.0000 mg | Freq: Two times a day (BID) | INTRAVENOUS | Status: DC
  Administered 2014-04-20 – 2014-04-21 (×3): 600 mg via INTRAVENOUS
  Filled 2014-04-20 (×6): qty 600

## 2014-04-20 NOTE — Progress Notes (Signed)
[facility]  Date of Admission:  04/17/2014  Antibiotics: Vancomycin  Subjective: Feels ok, some pain but much improved  Objective: Temp:  [98.1 F (36.7 C)-100.2 F (37.9 C)] 98.1 F (36.7 C) (05/05 0605) Pulse Rate:  [90-112] 96 (05/05 0605) Resp:  [14-18] 18 (05/05 0800) BP: (117-133)/(73-84) 123/83 mmHg (05/05 0605) SpO2:  [97 %-100 %] 97 % (05/05 0800)  General: awake, alert, nad Skin: no rashes Lungs: CTA B Cor: RRR Abdomen: soft, nt, nd Ext: right leg with some erythema, drain in  Lab Results Lab Results  Component Value Date   WBC 7.0 04/20/2014   HGB 9.2* 04/20/2014   HCT 28.8* 04/20/2014   MCV 82.1 04/20/2014   PLT 257 04/20/2014    Lab Results  Component Value Date   CREATININE 0.72 04/20/2014   BUN 9 04/20/2014   NA 134* 04/20/2014   K 4.0 04/20/2014   CL 94* 04/20/2014   CO2 28 04/20/2014    No results found for this basename: ALT, AST, GGT, ALKPHOS, BILITOT      Microbiology: Recent Results (from the past 240 hour(s))  CULTURE, BLOOD (ROUTINE X 2)     Status: None   Collection Time    04/17/14  4:50 PM      Result Value Ref Range Status   Specimen Description BLOOD LEFT ARM   Final   Special Requests     Final   Value: BOTTLES DRAWN AEROBIC AND ANAEROBIC  10CC BOTH BOTTLES   Culture  Setup Time     Final   Value: 04/17/2014 21:57     Performed at [facility]   Culture     Final   Value:        BLOOD CULTURE RECEIVED NO GROWTH TO DATE CULTURE WILL BE HELD FOR 5 DAYS BEFORE ISSUING A FINAL NEGATIVE REPORT     Performed at [facility]   Report Status PENDING   Incomplete  CULTURE, BLOOD (ROUTINE X 2)     Status: None   Collection Time    04/17/14  5:00 PM      Result Value Ref Range Status   Specimen Description BLOOD LEFT ARM   Final   Special Requests BOTTLES DRAWN AEROBIC ONLY 4CC   Final   Culture  Setup Time     Final   Value: 04/17/2014 21:57     Performed at [facility]   Culture     Final   Value:        BLOOD CULTURE RECEIVED NO GROWTH TO DATE CULTURE WILL BE HELD FOR 5 DAYS BEFORE ISSUING A FINAL NEGATIVE REPORT     Performed at [facility]   Report Status PENDING   Incomplete  SURGICAL PCR SCREEN     Status: None   Collection Time    04/19/14 12:43 PM      Result Value Ref Range Status   MRSA, PCR NEGATIVE  NEGATIVE Final   Staphylococcus aureus NEGATIVE  NEGATIVE Final   Comment:            The Xpert SA Assay (FDA     approved for NASAL specimens     in patients over 49 years of age),     is one component of     a comprehensive surveillance     program.  Test performance has     been validated by [facility] for patients greater  than or equal to 49 year old.     It is not intended     to diagnose infection nor to     guide or monitor treatment.  ANAEROBIC CULTURE     Status: None   Collection Time    04/19/14  5:41 PM      Result Value Ref Range Status   Specimen Description WOUND R HIP SUPERFICIAL   Final   Special Requests NONE   Final   Gram Stain     Final   Value: FEW WBC PRESENT,BOTH PMN AND MONONUCLEAR     NO ORGANISMS SEEN     Performed at [facility]   Culture     Final   Value: NO ANAEROBES ISOLATED; CULTURE IN PROGRESS FOR 5 DAYS     Performed at [facility]   Report Status PENDING   Incomplete  WOUND CULTURE     Status: None   Collection Time    04/19/14  5:41 PM      Result Value Ref Range Status   Specimen Description WOUND R HIP SUPERFICIAL   Final   Special Requests NONE   Final   Gram Stain     Final   Value: FEW WBC PRESENT,BOTH PMN AND MONONUCLEAR     NO SQUAMOUS EPITHELIAL CELLS SEEN     NO ORGANISMS SEEN     Performed at [facility]   Culture     Final   Value: NO GROWTH 1 DAY     Performed at [facility]   Report Status PENDING   Incomplete    Studies/Results: No results found.  Assessment/Plan: 1) cellulitis with superficial wound infection - was on keflex  chronically.  I have changed him to Teflaro which will cover usual organisms (Staph, Strep, MRSA, some anaerobes, GNR).  I will plan of 4-6 weeks IV. Culture negative to date.  Will narrow to any positive culture results.  Patient in agreement with plan.   Gardiner Barefootobert W Zakyra Kukuk, MD [facility] [email or web address] [phone] pager   [phone] cell 04/20/2014, 1:44 PM

## 2014-04-20 NOTE — Progress Notes (Signed)
Patient ID: Raymond [name], male   DOB: 03/08/1966, 48 y.o.   MRN: [id number]   Subjective: 1 Day Post-Op Procedure(s) (LRB): INCISION AND DRAINAGE - ON HANA TABLE (Right)   Patient reports pain as moderate.  Objective:   VITALS:   Filed Vitals:   04/20/14 0800  BP:   Pulse:   Temp:   Resp: 18    Neurovascular intact Compartment soft Dressing C/D/I LABS  Recent Labs  04/19/14 0420 04/20/14 0610  HGB 10.1* 9.2*  HCT 32.8* 28.8*  WBC 8.4 7.0  PLT 220 257     Recent Labs  04/19/14 0420 04/20/14 0610  NA 134* 134*  K 3.9 4.0  BUN 8 9  CREATININE 0.82 0.72  GLUCOSE 102* 102*     Assessment/Plan: 1 Day Post-Op Procedure(s) (LRB): INCISION AND DRAINAGE - ON HANA TABLE (Right)   Advance diet Up with therapy Continue ABX therapy due to Post-op infection Plan for discharge tomorrow  Raymond [name]  [facility] [phone]  04/20/2014, 9:52 AM

## 2014-04-20 NOTE — Care Management Note (Signed)
    Page 1 of 1   04/20/2014     11:17:03 AM CARE MANAGEMENT NOTE 04/20/2014  Patient:  Raymond [name],Raymond [name]   Account Number:  [id number]  Date Initiated:  04/20/2014  Documentation initiated by:  Lorenda IshiharaPEELE,Kiara Mcdowell  Subjective/Objective Assessment:   48 yo male admitted with wound cellulitis requiring I&[name]. PTA lived at home alone.     Action/Plan:   Home when stable   Anticipated DC Date:  04/21/2014   Anticipated DC Plan:  HOME W HOME HEALTH SERVICES      DC Planning Services  CM consult      [facility]  Resumption Of Svcs/PTA Provider   Choice offered to / List presented to:  C-1 Patient        HH arranged  HH-1 RN  IV Antibiotics      HH agency  [facility]   Status of service:  Completed, signed off Medicare Important Message given?  NA - LOS <3 / Initial given by admissions (If response is "NO", the following Medicare IM given date fields will be blank) Date Medicare IM given:   Date Additional Medicare IM given:    Discharge Disposition:  HOME W HOME HEALTH SERVICES  Per UR Regulation:  Reviewed for med. necessity/level of care/duration of stay  If discussed at Long Length of Stay Meetings, dates discussed:    Comments:

## 2014-04-20 NOTE — Evaluation (Signed)
I have reviewed this note and agree with all findings. Kati Halston Kintz, PT, DPT Pager: [phone]   

## 2014-04-20 NOTE — Progress Notes (Signed)
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  Pt shaky during gait due to increased hip pain despite IV premedication.  Performed activity and exercises to tolerance.  Precautions  Precautions Fall  Precaution Comments direct anterior approach, no precautions  Restrictions  RLE Weight Bearing WBAT  Cognition  Arousal/Alertness Awake/alert  Behavior During Therapy WFL for tasks assessed/performed  Overall Cognitive Status Within Functional Limits for tasks assessed  Bed Mobility  Overal bed mobility Needs Assistance  Bed Mobility Supine to Sit;Sit to Supine  Supine to sit Min assist  Sit to supine Min assist  General bed mobility comments assist to manage R LE due to pain and weakness  Transfers  Overall transfer level Needs assistance  Equipment used Rolling walker (2 wheeled)  Transfers Sit to/from Stand  Sit to Stand Min guard  General transfer comment verbal cues for safety  Ambulation/Gait  Ambulation/Gait assistance Min guard  Ambulation Distance (Feet) 80 Feet  Assistive device Rolling walker (2 wheeled)  Gait Pattern/deviations Step-to pattern;Decreased step length - right;Decreased stance time - right;Antalgic  Gait velocity decreased  General Gait Details verbal cues for sequencing with RW, increased time for ambulation due to pain   Exercises  Exercises General Lower Extremity  General Exercises - Lower Extremity  Ankle Circles/Pumps AROM;Both;15 reps;Supine  Quad Sets AROM;Left;10  reps;Supine  Gluteal Sets AROM;Both;10 reps;Supine  Short Arc Quad AAROM;Right;10 reps;Supine  Heel Slides AAROM;Right;10 reps;Supine  Hip ABduction/ADduction AAROM;Right;10 reps;Supine  Straight Leg Raises AAROM;Right;5 reps;Supine  PT - End of Session  Equipment Utilized During Treatment Gait belt  Activity Tolerance Patient limited by pain  Patient left in bed;with call bell/phone within reach  PT - Assessment/Plan  PT Plan Current plan remains appropriate  PT Frequency 7X/week  Follow Up Recommendations Home health PT  PT equipment None recommended by PT  PT Goal Progression  Progress towards PT goals Progressing toward goals  PT General Charges  $$ ACUTE PT VISIT 1 Procedure  PT Treatments  $Gait Training 8-22 mins  $Therapeutic Exercise 8-22 mins   Zenovia JarredKati Aamari West, PT, DPT 04/20/2014 Pager: [phone]

## 2014-04-20 NOTE — Op Note (Signed)
NAME:  Raymond [name], Raymond [name]                 ACCOUNT NO.:  [id number]  MEDICAL RECORD NO.:  [id number]  LOCATION:  1536                         FACILITY[facility]  PHYSICIAN:  Madlyn FrankelMatthew D. Charlann Boxerlin, M.D.  DATE OF BIRTH:  1966-07-02  DATE OF PROCEDURE:  04/19/2014 DATE OF DISCHARGE:                              OPERATIVE REPORT   PREOPERATIVE DIAGNOSIS:  Superficial left hip wound infection, following total hip arthroplasty with reactive cellulitis.  POSTOPERATIVE DIAGNOSIS:  Superficial left hip wound infection, following total hip arthroplasty with reactive cellulitis.  PROCEDURE:  Excisional and nonexcisional debridement of right hip wound of over a 4-5 inch area using pulse lavage, excising skin and subcutaneous tissue, and old and retained sutures that were placed.  SURGEON:  Durene RomansMatthew Merrit Friesen, MD.  ASSISTANT:  Jaquelyn BitterStephen J. Chabon, PA-C.  Note that, Mr. Jannette SpannerChabon was present for the entirety of the case from preoperative positioning to perioperative management of the operative extremity, general facilitation of the case, and primary wound closure.  ANESTHESIA:  General.  SPECIMENS:  Fluid from the superficial to the fascia was taken for culture.  DRAINS:  One medium Hemovac.  BLOOD LOSS:  Less than 50 mL.  COMPLICATIONS:  None.  INDICATIONS FOR PROCEDURE:  [name] is a 48 year old male, who is recently status post right total replacement 2 weeks ago.  He had been doing extremely well, stating that this recovery from his right hip surgery was even better than his left hip surgery.  He was seen in the office and doing well without issue.  He presented to the hospital on Apr 17, 2014 with increased redness and pain in his right hip.  He was seen and evaluated, admitted to the hospital, placed on antibiotics, and Infectious Disease consult.  Interestingly enough, [name] had his left hip replaced and the exact same thing happened in the early postoperative period with his left hip.  Despite  knowing this and all preparation and all precautions taken for the right hip surgery, unfortunately, he developed a cellulitic response as well.  Reportedly, in his left hip, no cultures ever grew but he was treated with IV antibiotics and has had no problems with the hip since that time.  Given these findings and now he had some improvement with IV antibiotics, he and I had a long discussion, and felt that it was not worth sitting on this, but to go ahead and do what we had done on the left hip in terms of I and D and debridement, wash it out very well, and then treat with antibiotics again.  We discussed this in terms of risks of infection, recurrence of infection, need for future surgery. Questions were encouraged and reviewed.  Consent was obtained.  PROCEDURE IN DETAIL:  The patient was brought to operative theater. Once adequate anesthesia, preoperative antibiotics already administered as vancomycin on the floor through ID consult, he was positioned supine on the table in case further procedure need to be carried out.  His right hip area was prepped and draped in sterile fashion.  His old incision was demarcated.  A time-out was performed, identifying the patient, planned procedure, and extremity.  At this point, I  incised the skin.  Upon entry in the skin, there was a rush of purulent fluid which was cultured.  His fascial layer was intact.  At this point, I irrigated the superficial wound with 1 L normal saline solution.  We then debrided and removed all sutures that were superficial, debrided subcutaneous tissue sharply with a scalpel and cauterized as necessary.  At this point, based on his presentation, I went ahead and removed the suture line of his fascia and took an opportunity to wash deep.  Inside the hip joint, was just seroma fluid, no signs of any purulence.  Nonetheless, I did wash this out.  Total irrigation of his hip was 6 L normal saline solution.  Upon  completion of this excisional and nonexcisional debridement of this right hip wound, I elected to placed a medium Hemovac drain to drain it in the postop period for a day or 2.  His fascial layer was reapproximated using #1 PDS suture, this subcutaneous layer with the PDS suture and a running Monocryl.  His hip was then cleaned, dried, and dressed sterilely using Dermabond and Aquacel dressing.  Findings reviewed with the family.  Infectious Disease on board, we will get a PICC line ordered for 4-6 weeks of IV antibiotics, probably 6 weeks supplemented with p.o. antibiotics per ID.     Madlyn FrankelMatthew D. Charlann Boxerlin, M.D.     MDO/MEDQ  D:  04/19/2014  T:  04/20/2014  Job:  [id number]

## 2014-04-20 NOTE — Progress Notes (Signed)
OT Cancellation Note  Patient Details Name: Raymond [name] MRN: [id number] DOB: 01/20/1966   Cancelled Treatment:    Reason Eval/Treat Not Completed: Other (comment).  Pt currently needs min A with PT due to pain.  If pt continues to need assistance tomorrow, we will evaluate him.    Raymond [name] 04/20/2014, 3:45 PM Raymond [name], OTR/L [phone] 04/20/2014

## 2014-04-20 NOTE — Evaluation (Signed)
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  Pt required assist with R LE for bed mobility due to pain.  Pt ambulated in hallway with RW, reporting significant pain.  Plan is to return home with HHPT as he had progressed well with ambulation following his R THA prior to developing an infection.  Pt will benefit from skilled PT to increase their independence and safety with mobility to allow discharge.    Follow Up Recommendations Home health PT    Equipment Recommendations  None recommended by PT    Recommendations for Other Services       Precautions / Restrictions Precautions Precautions: Fall Precaution Comments: direct anterior approach, no precautions Restrictions RLE Weight Bearing: Weight bearing as tolerated      Mobility  Bed Mobility Overal bed mobility: Needs Assistance Bed Mobility: Supine to Sit;Sit to Supine     Supine to sit: Min assist Sit to supine: Min assist   General bed mobility comments: assist to manage R LE due to pain and weakness  Transfers Overall transfer level: Needs assistance Equipment used: Rolling walker (2 wheeled) Transfers: Sit to/from Stand Sit to Stand: Min guard         General transfer comment: verbal cues for sequence  Ambulation/Gait Ambulation/Gait assistance: Min guard Ambulation Distance (Feet): 80 Feet   Gait Pattern/deviations: Decreased step length - right;Step-to pattern;Antalgic Gait velocity: decreased   General Gait Details: verbal cues for sequencing with RW, increased time for  ambulation due to pain   Stairs            Wheelchair Mobility    Modified Rankin (Stroke Patients Only)       Balance                                             Pertinent Vitals/Pain Pt premedicated.  Reported pain 8/10 with ambulation.  Activity to tolerance.  Ice applied following treatment.    Home Living Family/patient expects to be discharged to:: Private residence Living Arrangements: Alone Available Help at Discharge: Family Type of Home: House Home Access: Stairs to enter Entrance Stairs-Rails: Right;Left;Can reach both Entrance Stairs-Number of Steps: 4 Home Layout: One level Home Equipment: Crutches;Walker - 2 wheels;Bedside commode Additional Comments: Mother lives next door    Prior Function Level of Independence: Independent with assistive device(s)               Hand Dominance        Extremity/Trunk Assessment   Upper Extremity Assessment: Overall WFL for tasks assessed           Lower Extremity Assessment: RLE deficits/detail RLE Deficits / Details: fair contraction of muscles at hip    Cervical / Trunk Assessment: Normal  Communication   Communication: No difficulties  Cognition Arousal/Alertness: Awake/alert Behavior During Therapy: WFL for tasks assessed/performed Overall Cognitive Status: Within Functional Limits for tasks assessed  General Comments      Exercises        Assessment/Plan    PT Assessment Patient needs continued PT services  PT Diagnosis Difficulty walking   PT Problem List Decreased strength;Decreased range of motion;Decreased activity tolerance;Decreased balance;Decreased mobility;Decreased coordination;Decreased knowledge of use of DME  PT Treatment Interventions DME instruction;Gait training;Stair training;Functional mobility training;Therapeutic activities;Therapeutic exercise;Balance training;Patient/family education   PT Goals (Current goals can  be found in the Care Plan section) Acute Rehab PT Goals Patient Stated Goal: none stated PT Goal Formulation: With patient Time For Goal Achievement: 04/25/14 Potential to Achieve Goals: Good    Frequency 7X/week   Barriers to discharge        Co-evaluation               End of Session Equipment Utilized During Treatment: Gait belt Activity Tolerance: Patient tolerated treatment well Patient left: in bed;with call bell/phone within reach;with family/visitor present           Time: [id number] PT Time Calculation (min): 18 min   Charges:   PT Evaluation $Initial PT Evaluation Tier I: 1 Procedure PT Treatments $Gait Training: 8-22 mins   PT G CodesBufford Lope:          Shyler Hamill 04/20/2014, 1:21 PM Bufford LopeLaura Shandon [place]lingame SPT 04/20/2014

## 2014-04-21 DIAGNOSIS — Y849 Medical procedure, unspecified as the cause of abnormal reaction of the patient, or of later complication, without mention of misadventure at the time of the procedure: Secondary | ICD-10-CM

## 2014-04-21 DIAGNOSIS — T8140XA Infection following a procedure, unspecified, initial encounter: Secondary | ICD-10-CM

## 2014-04-21 LAB — CBC
HCT: 27.8 % — ABNORMAL LOW (ref 39.0–52.0)
Hemoglobin: 8.7 g/dL — ABNORMAL LOW (ref 13.0–17.0)
MCH: 25.9 pg — AB (ref 26.0–34.0)
MCHC: 31.3 g/dL (ref 30.0–36.0)
MCV: 82.7 fL (ref 78.0–100.0)
Platelets: 243 10*3/uL (ref 150–400)
RBC: 3.36 MIL/uL — AB (ref 4.22–5.81)
RDW: 16 % — ABNORMAL HIGH (ref 11.5–15.5)
WBC: 6.4 10*3/uL (ref 4.0–10.5)

## 2014-04-21 LAB — BASIC METABOLIC PANEL
BUN: 7 mg/dL (ref 6–23)
CALCIUM: 9 mg/dL (ref 8.4–10.5)
CO2: 26 meq/L (ref 19–32)
CREATININE: 0.69 mg/dL (ref 0.50–1.35)
Chloride: 100 mEq/L (ref 96–112)
GFR calc Af Amer: >90 mL/min (ref >90)
Glucose, Bld: 99 mg/dL (ref 70–99)
Potassium: 4 mEq/L (ref 3.7–5.3)
SODIUM: 136 meq/L — AB (ref 137–147)

## 2014-04-21 LAB — WOUND CULTURE: Culture: NO GROWTH

## 2014-04-21 MED ORDER — HEPARIN SOD (PORK) LOCK FLUSH 100 UNIT/ML IV SOLN
250.0000 [IU] | INTRAVENOUS | Status: DC | PRN
  Administered 2014-04-21: 250 [IU]
  Filled 2014-04-21: qty 3

## 2014-04-21 MED ORDER — HYDROCODONE-ACETAMINOPHEN 5-325 MG PO TABS
1.0000 | ORAL_TABLET | Freq: Four times a day (QID) | ORAL | Status: AC
Start: 2014-04-21 — End: ?

## 2014-04-21 MED ORDER — FERROUS SULFATE 325 (65 FE) MG PO TABS: 325.0000 mg | ORAL_TABLET | Freq: Three times a day (TID) | ORAL | Status: AC

## 2014-04-21 MED ORDER — METHOCARBAMOL 500 MG PO TABS
500.0000 mg | ORAL_TABLET | Freq: Four times a day (QID) | ORAL | Status: AC | PRN
Start: 2014-04-21 — End: ?

## 2014-04-21 MED ORDER — SODIUM CHLORIDE 0.9 % IV SOLN: 600.0000 mg | Freq: Two times a day (BID) | INTRAVENOUS | Status: DC

## 2014-04-21 MED ORDER — HEPARIN SOD (PORK) LOCK FLUSH 100 UNIT/ML IV SOLN
250.0000 [IU] | Freq: Every day | INTRAVENOUS | Status: DC
  Filled 2014-04-21: qty 3

## 2014-04-21 NOTE — Progress Notes (Signed)
   Subjective: 2 Days Post-Op Procedure(s) (LRB): INCISION AND DRAINAGE - ON HANA TABLE (Right)   Patient reports pain as mild, pain resolving.  Pain controlled with medication. No events throughout the night. Ready to be discharged home.  Objective:   VITALS:   Filed Vitals:   04/21/14  BP: 121/74  Pulse: 75  Temp: 97.8 F (36.6 C)   Resp: 18    Neurovascular intact Dorsiflexion/Plantar flexion intact Incision: dressing C/D/I  LABS  Recent Labs  04/19/14 0420 04/20/14 0610 04/21/14 0440  HGB 10.1* 9.2* 8.7*  HCT 32.8* 28.8* 27.8*  WBC 8.4 7.0 6.4  PLT 220 257 243     Recent Labs  04/19/14 0420 04/20/14 0610 04/21/14 0440  NA 134* 134* 136*  K 3.9 4.0 4.0  BUN 8 9 7   CREATININE 0.82 0.72 0.69  GLUCOSE 102* 102* 99     Assessment/Plan: 2 Days Post-Op Procedure(s) (LRB): INCISION AND DRAINAGE - ON HANA TABLE (Right) HV drain d/c'ed Up with therapy Discharge home with home health [facility] Follow up with OLIN,Jacqualine Weichel D in 2 weeks.  Contact information:  [facility] [address], [address] [place] [id number] [phone]       Anastasio AuerbachMatthew S. Jhordan Kinter   PAC  04/21/2014, 10:36 AM

## 2014-04-21 NOTE — Evaluation (Signed)
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 Education complete regarding ADL activity s/p THA.  No DME needs    Follow Up Recommendations  No OT follow up    Equipment Recommendations  None recommended by OT    Recommendations for Other Services       Precautions / Restrictions Precautions Precautions: Fall Precaution Comments: direct anterior approach, no precautions Restrictions RLE Weight Bearing: Weight bearing as tolerated      Mobility Bed Mobility Overal bed mobility: Needs Assistance Bed Mobility: Supine to Sit;Sit to Supine     Supine to sit: Supervision Sit to supine: Supervision   General bed mobility comments: no assist for R LE required today, pt reports better pain control  Transfers Overall transfer level: Needs assistance Equipment used: Rolling walker (2 wheeled) Transfers: Sit to/from Stand Sit to Stand: Min guard         General transfer comment: verbal cues for safety including keeping RW closeby upon return to sitting    Balance                                            ADL Overall ADL's : Needs assistance/impaired Eating/Feeding: Independent;Sitting   Grooming: Wash/dry hands;Min guard;Standing   Upper Body Bathing: Set up;Sitting   Lower Body Bathing: Minimal assistance;Sit to/from stand   Upper Body Dressing : Set up;Sitting   Lower Body Dressing: Minimal assistance;Sit to/from stand   Toilet Transfer: Min guard;Ambulation           Functional mobility during ADLs: Min guard;Rolling walker       Vision                      Perception     Praxis            Hand Dominance     Extremity/Trunk Assessment             Communication Communication Communication: No difficulties   Cognition Arousal/Alertness: Awake/alert Behavior During Therapy: WFL for tasks assessed/performed Overall Cognitive Status: Within Functional Limits for tasks assessed                     General Comments       Exercises       Shoulder Instructions      Home Living Family/patient expects to be discharged to:: Private residence Living Arrangements: Alone Available Help at Discharge: Family Type of Home: House Home Access: Stairs to enter Secretary/administratorntrance Stairs-Number of Steps: 4 Entrance Stairs-Rails: Right;Left;Can reach both Home Layout: One level     Bathroom Shower/Tub: IT trainerTub/shower unit;Curtain   Bathroom Toilet: Standard     Home Equipment: Crutches;Walker - 2 wheels;Bedside commode   Additional Comments: Mother lives next door      Prior Functioning/Environment Level of Independence: Independent with assistive device(s)             OT Diagnosis:     OT Problem List:  OT Treatment/Interventions:      OT Goals(Current goals can be found in the care plan section) Acute Rehab OT Goals Patient Stated Goal: none stated  OT Frequency:     Barriers to D/C:            Co-evaluation              End of Session Equipment Utilized During Treatment: Rolling walker Nurse Communication:  (pt requesting stomach meds)  Activity Tolerance: Patient limited by pain;Patient tolerated treatment well Patient left: in chair;with call bell/phone within reach   Time: 1244-1305 OT Time Calculation (min): 21 min Charges:  OT General Charges $OT Visit: 1 Procedure OT Evaluation $Initial OT Evaluation Tier I: 1 Procedure G-Codes:    Alba CoryLorraine D Ashden Sonnenberg 04/21/2014, 1:53 PM

## 2014-04-21 NOTE — Progress Notes (Signed)
Nurse reviewed discharge instructions with pt and his family.  Pt discharging with PICC line as pt will be on IV antibiotics at home.  Pt voiced no concerns at time of discharge.

## 2014-04-21 NOTE — Progress Notes (Signed)
Physical Therapy Treatment Patient Details Name: Raymond [name] MRN: [id number] DOB: 07/06/1966 Today's Date: 04/21/2014    History of Present Illness Pt is a 48 year old male s/p I&D of R hip using Hanna table, wound cellulitis, s/p R THA direct anterior 4/28; PMH L THA which also required I&D from wound cellulitis, kidney stones    PT Comments    Pt reports feeling better today and with better pain control.  Pt tolerated good distance of ambulation and practiced safe stair technique.  Pt also performed exercises.  Pt will likely d/c home today and had no further questions/concerns.   Follow Up Recommendations  Home health PT     Equipment Recommendations  None recommended by PT    Recommendations for Other Services       Precautions / Restrictions Precautions Precautions: Fall Precaution Comments: direct anterior approach, no precautions Restrictions Weight Bearing Restrictions: Yes RLE Weight Bearing: Weight bearing as tolerated    Mobility  Bed Mobility Overal bed mobility: Needs Assistance Bed Mobility: Supine to Sit;Sit to Supine     Supine to sit: Supervision Sit to supine: Supervision   General bed mobility comments: no assist for R LE required today, pt reports better pain control  Transfers Overall transfer level: Needs assistance Equipment used: Rolling walker (2 wheeled) Transfers: Sit to/from Stand Sit to Stand: Min guard         General transfer comment: verbal cues for safety including keeping RW closeby upon return to sitting  Ambulation/Gait Ambulation/Gait assistance: Min guard Ambulation Distance (Feet): 200 Feet Assistive device: Rolling walker (2 wheeled) Gait Pattern/deviations: Step-through pattern;Decreased step length - left;Antalgic     General Gait Details: verbal cues for posture, no shakiness today during ambulation, pt reports feeling better with less pain   Stairs Stairs: Yes Stairs assistance: Min guard Stair Management:  Two rails;Forwards Number of Stairs: 3 General stair comments: pt reports 2 rails at home so reviewed safe stair technique and sequence  Wheelchair Mobility    Modified Rankin (Stroke Patients Only)       Balance                                    Cognition Arousal/Alertness: Awake/alert Behavior During Therapy: WFL for tasks assessed/performed Overall Cognitive Status: Within Functional Limits for tasks assessed                      Exercises General Exercises - Lower Extremity Ankle Circles/Pumps: AROM;Both;15 reps;Supine Quad Sets: AROM;Left;10 reps;Supine Gluteal Sets: AROM;Both;10 reps;Supine Short Arc Quad: Right;10 reps;Supine;AROM Heel Slides: Right;10 reps;Supine;AROM Hip ABduction/ADduction: Right;10 reps;Supine;AROM Straight Leg Raises: AAROM;Right;Supine;10 reps    General Comments        Pertinent Vitals/Pain Pt reports close to time for needing more pain meds however reports better pain control today, activity to tolerance, ice pack applied end of session    Home Living                      Prior Function            PT Goals (current goals can now be found in the care plan section) Progress towards PT goals: Progressing toward goals    Frequency  7X/week    PT Plan Current plan remains appropriate    Co-evaluation             End of Session  Equipment Utilized During Treatment: Gait belt Activity Tolerance: Patient tolerated treatment well Patient left: in bed;with call bell/phone within reach     Time: 0936-1003 PT Time Calculation (min): 27 min  Charges:  $Gait Training: 8-22 mins $Therapeutic Exercise: 8-22 mins                    G Codes:      Lynnell CatalanKatherine E Sharetha Newson 04/21/2014, 10:13 AM Zenovia JarredKati Arihant Pennings, PT, DPT 04/21/2014 Pager: [phone]

## 2014-04-21 NOTE — Progress Notes (Signed)
[facility]  Date of Admission:  04/17/2014  Antibiotics: ceftaroline day 2  Subjective: No complaints  Objective: Temp:  [98.7 F (37.1 C)-99.4 F (37.4 C)] 98.8 F (37.1 C) (05/06 1400) Pulse Rate:  [75-88] 88 (05/06 1400) Resp:  [16-20] 20 (05/06 1400) BP: (121-130)/(71-78) 122/71 mmHg (05/06 1400) SpO2:  [93 %-98 %] 96 % (05/06 1400)  General: awake, alert, nad Skin: no rashes Ext: + dressing  Lab Results Lab Results  Component Value Date   WBC 6.4 04/21/2014   HGB 8.7* 04/21/2014   HCT 27.8* 04/21/2014   MCV 82.7 04/21/2014   PLT 243 04/21/2014    Lab Results  Component Value Date   CREATININE 0.69 04/21/2014   BUN 7 04/21/2014   NA 136* 04/21/2014   K 4.0 04/21/2014   CL 100 04/21/2014   CO2 26 04/21/2014    No results found for this basename: ALT, AST, GGT, ALKPHOS, BILITOT      Microbiology: Recent Results (from the past 240 hour(s))  CULTURE, BLOOD (ROUTINE X 2)     Status: None   Collection Time    04/17/14  4:50 PM      Result Value Ref Range Status   Specimen Description BLOOD LEFT ARM   Final   Special Requests     Final   Value: BOTTLES DRAWN AEROBIC AND ANAEROBIC  10CC BOTH BOTTLES   Culture  Setup Time     Final   Value: 04/17/2014 21:57     Performed at [facility]   Culture     Final   Value:        BLOOD CULTURE RECEIVED NO GROWTH TO DATE CULTURE WILL BE HELD FOR 5 DAYS BEFORE ISSUING A FINAL NEGATIVE REPORT     Performed at [facility]   Report Status PENDING   Incomplete  CULTURE, BLOOD (ROUTINE X 2)     Status: None   Collection Time    04/17/14  5:00 PM      Result Value Ref Range Status   Specimen Description BLOOD LEFT ARM   Final   Special Requests BOTTLES DRAWN AEROBIC ONLY 4CC   Final   Culture  Setup Time     Final   Value: 04/17/2014 21:57     Performed at [facility]   Culture     Final   Value:        BLOOD CULTURE RECEIVED NO GROWTH TO DATE CULTURE WILL BE HELD FOR 5 DAYS BEFORE  ISSUING A FINAL NEGATIVE REPORT     Performed at [facility]   Report Status PENDING   Incomplete  SURGICAL PCR SCREEN     Status: None   Collection Time    04/19/14 12:43 PM      Result Value Ref Range Status   MRSA, PCR NEGATIVE  NEGATIVE Final   Staphylococcus aureus NEGATIVE  NEGATIVE Final   Comment:            The Xpert SA Assay (FDA     approved for NASAL specimens     in patients over 49 years of age),     is one component of     a comprehensive surveillance     program.  Test performance has     been validated by [facility] for patients greater     than or equal to 49 year old.     It is not  intended     to diagnose infection nor to     guide or monitor treatment.  ANAEROBIC CULTURE     Status: None   Collection Time    04/19/14  5:41 PM      Result Value Ref Range Status   Specimen Description WOUND R HIP SUPERFICIAL   Final   Special Requests NONE   Final   Gram Stain     Final   Value: FEW WBC PRESENT,BOTH PMN AND MONONUCLEAR     NO ORGANISMS SEEN     Performed at [facility]   Culture     Final   Value: NO ANAEROBES ISOLATED; CULTURE IN PROGRESS FOR 5 DAYS     Performed at [facility]   Report Status PENDING   Incomplete  WOUND CULTURE     Status: None   Collection Time    04/19/14  5:41 PM      Result Value Ref Range Status   Specimen Description WOUND R HIP SUPERFICIAL   Final   Special Requests NONE   Final   Gram Stain     Final   Value: FEW WBC PRESENT,BOTH PMN AND MONONUCLEAR     NO SQUAMOUS EPITHELIAL CELLS SEEN     NO ORGANISMS SEEN     Performed at [facility]   Culture     Final   Value: NO GROWTH 2 DAYS     Performed at [facility]   Report Status 04/21/2014 FINAL   Final    Studies/Results: No results found.  Assessment/Plan: 1) cellulitis with postop wound infection, superficial - Unable to afford Teflaro so will change to vancomycin.  Home health aware and will make change.   -will  plan on 4-6 weeks of antibiotics through June 1 (4 weeks).   -vancomycin trough goal of 12-15 -antibiotics per home health protocol  -we will arrange follow up in [facility] in about 2 weeks  Thanks for consult  Gardiner Barefootobert W Comer, MD [facility] [email or web address] [phone] pager   [phone] 04/21/2014, 2:27 PM

## 2014-04-23 LAB — CULTURE, BLOOD (ROUTINE X 2)
Culture: NO GROWTH
Culture: NO GROWTH

## 2014-04-24 LAB — ANAEROBIC CULTURE

## 2014-04-26 NOTE — Discharge Summary (Signed)
Physician Discharge Summary  Patient ID: Raymond [name] MRN: [id number] DOB/AGE: 49/07/1966 49 y.o.  Admit date: 04/17/2014 Discharge date: 04/21/2014   Procedures:  Procedure(s) (LRB): INCISION AND DRAINAGE - ON HANA TABLE (Right)  Attending Physician:  Dr. Durene RomansMatthew Olin   Admission Diagnoses:   Postoperative wound cellulitis  Discharge Diagnoses:  Principal Problem:   Postoperative wound cellulitis Active Problems:   History of total left hip arthroplasty   Expected blood loss anemia   Hematoma, postoperative   History of total right hip arthroplasty   Prosthetic hip infection   Avascular necrosis of bones of both hips   HTN (hypertension)   GERD (gastroesophageal reflux disease)   Dyslipidemia   H/O syncope   S/P total hip arthroplasty  Past Medical History  Diagnosis Date  . Hypertension   . GERD (gastroesophageal reflux disease)   . Rib fractures     left side 06/2013   . Depression     NOT ON ANY MEDS NOW - DOING BETTER NOW  . History of kidney stones   . Arthritis     RIGHT HIP AVASCULAR NECROSIS- PAINFUL HIP;  S/P LEFT  HIP REPLACEMENT 07-28-14    HPI: The patient is a 48 y.o. male s/p right THA via anterior approach this past Tuesday by Dr. Charlann Boxerlin, did extremely well first 2 days post op requiring no pain meds, now with approximately 24 hrs of increasing right hip and thigh pain, erythema and fevers. Similar episode involving left hip after surgery last fall.  PCP: Irena ReichmannOLLINS, DANA, DO   Discharged Condition: good  Hospital Course:  Patient was admitted to the hospital on 04/17/2014. He was placed on antibiotics and had an uneventful stay until undergoing the above stated procedure on 04/19/2014.  Patient tolerated the procedure well and brought to the recovery room in good condition and subsequently to the floor.  POD #1 BP: 123/83 ; Pulse: 96 ; Temp: 98.1 F (36.7 C) ; Resp: 18  Patient reports pain as moderate. No events throughout the night. Neurovascular  intact, compartment soft and dressing C/D/I  LABS  Basename    HGB  9.2  HCT  28.8   POD #2  BP: 121/74 ; Pulse: 75 ; Temp: 97.8 F (36.6 C) ; Resp: 18  Patient reports pain as mild, pain resolving. Pain controlled with medication. No events throughout the night. Ready to be discharged home. Neurovascular intact, dorsiflexion/plantar flexion intact and incision: dressing C/D/I  LABS  Basename    HGB  8.7  HCT  27.8    Discharge Exam: General appearance: alert, cooperative and no distress Extremities: Homans sign is negative, no sign of DVT  Disposition:     Home or Self Care with follow up in 2 weeks   Follow-up Information   Follow up with Shelda PalLIN,Osmel Dykstra D, MD. Schedule an appointment as soon as possible for a visit in 2 weeks.   Specialty:  Orthopedic Surgery   Contact information:   [address] [place] [address] [phone]       Discharge Orders   Future Appointments Provider Department Dept Phone   05/11/2014 9:45 AM Gardiner Barefootobert W Comer, MD [facility] [phone]   Future Orders Complete By Expires   Call MD / Call 911  As directed    Constipation Prevention  As directed    Diet - low sodium heart healthy  As directed    Discharge instructions  As directed    Increase  activity slowly as tolerated  As directed    Weight bearing as tolerated  As directed    Questions:     Laterality:     Extremity:          Medication List    STOP taking these medications       cephALEXin 500 MG capsule  Commonly known as:  KEFLEX     ibuprofen 200 MG tablet  Commonly known as:  ADVIL,MOTRIN     oxyCODONE 5 MG immediate release tablet  Commonly known as:  Oxy IR/ROXICODONE      TAKE these medications       aspirin EC 325 MG tablet  Take 325 mg by mouth daily.     atorvastatin 40 MG tablet  Commonly known as:  LIPITOR  Take 40 mg by mouth every morning.     ceftaroline 600 mg in sodium chloride 0.9 %  250 mL  Inject 600 mg into the vein every 12 (twelve) hours.     DSS 100 MG Caps  Take 100 mg by mouth 2 (two) times daily.     ferrous sulfate 325 (65 FE) MG tablet  Take 1 tablet (325 mg total) by mouth 3 (three) times daily after meals.     HYDROcodone-acetaminophen 5-325 MG per tablet  Commonly known as:  NORCO/VICODIN  Take 1-2 tablets by mouth every 6 (six) hours.     indomethacin 50 MG capsule  Commonly known as:  INDOCIN  Take 50 mg by mouth 2 (two) times daily with a meal.     lisinopril 40 MG tablet  Commonly known as:  PRINIVIL,ZESTRIL  Take 40 mg by mouth every morning.     methocarbamol 500 MG tablet  Commonly known as:  ROBAXIN  Take 1 tablet (500 mg total) by mouth every 6 (six) hours as needed for muscle spasms.     omeprazole 40 MG capsule  Commonly known as:  PRILOSEC  Take 40 mg by mouth daily.     polyethylene glycol packet  Commonly known as:  MIRALAX / GLYCOLAX  Take 17 g by mouth daily as needed for mild constipation.     verapamil 100 MG 24 hr capsule  Commonly known as:  VERELAN  Take 100 mg by mouth every morning.         Signed: Anastasio AuerbachMatthew S. Seini Lannom   PAC  04/26/2014, 9:45 AM

## 2014-04-26 NOTE — Discharge Summary (Signed)
Physician Discharge Summary  Patient ID: Raymond [name] MRN: [id number] DOB/AGE: 49/07/1966 49 y.o.  Admit date: 04/13/2014 Discharge date: 04/14/2014   Procedures:  Procedure(s) (LRB): RIGHT TOTAL HIP ARTHROPLASTY ANTERIOR APPROACH (Right)  Attending Physician:  Dr. Durene RomansMatthew Olin   Admission Diagnoses:   Right hip AVN / pain  Discharge Diagnoses:  Active Problems:   History of total right hip arthroplasty  Past Medical History  Diagnosis Date  . Hypertension   . GERD (gastroesophageal reflux disease)   . Rib fractures     left side 06/2013   . Depression     NOT ON ANY MEDS NOW - DOING BETTER NOW  . History of kidney stones   . Arthritis     RIGHT HIP AVASCULAR NECROSIS- PAINFUL HIP;  S/P LEFT  HIP REPLACEMENT 07-28-14    HPI: Raymond PeterBerry D Kosel, 48 y.o. male, has a history of pain and functional disability in the right hip(s) due to arthritis and patient has failed non-surgical conservative treatments for greater than 12 weeks to include NSAID's and/or analgesics, use of assistive devices, activity modification and oral steroids. Onset of symptoms was gradual starting months ago with rapidlly worsening course since that time.The patient noted prior procedures of the hip to include arthroplasty on the left hip(s). Patient currently rates pain in the right hip at 9 out of 10 with activity. Patient has night pain, worsening of pain with activity and weight bearing, trendelenberg gait, pain that interfers with activities of daily living and pain with passive range of motion. Patient has evidence of periarticular osteophytes, joint space narrowing and AVN by imaging studies. This condition presents safety issues increasing the risk of falls. There is no current active infection. Risks, benefits and expectations were discussed with the patient. Risks including but not limited to the risk of anesthesia, blood clots, nerve damage, blood vessel damage, failure of the prosthesis, infection and up to  and including death. Patient understand the risks, benefits and expectations and wishes to proceed with surgery.   PCP: Irena ReichmannOLLINS, DANA, DO   Discharged Condition: good  Hospital Course:  Patient underwent the above stated procedure on 04/13/2014. Patient tolerated the procedure well and brought to the recovery room in good condition and subsequently to the floor.  POD #1 BP: 131/90 ; Pulse: 107 ; Temp: 98.1 F (36.7 C) ; Resp: 16 Patient reports pain as mild. Patient seen in rounds with Dr. Charlann Boxerlin. Patient is well, and has had no acute complaints or problems. No SOB or chest pain. No problems overnight.  Neurovascular intact, dorsiflexion/plantar flexion intact, incision: dressing C/D/I, no cellulitis present and compartment soft.   LABS  Basename    HGB  11.3  HCT  36.5    Discharge Exam: General appearance: alert, cooperative and no distress Extremities: Homans sign is negative, no sign of DVT, no edema, redness or tenderness in the calves or thighs and no ulcers, gangrene or trophic changes  Disposition:    Home or Self Care with follow up in 2 weeks   Follow-up Information   Follow up with Shelda PalLIN,Lativia Velie D, MD. Schedule an appointment as soon as possible for a visit in 2 weeks.   Specialty:  Orthopedic Surgery   Contact information:   [address] [place] [id number] [phone]       Follow up with [facility]. (home health physical therapy)    Contact information:   [address] [place] [id number] [phone]  Discharge Orders   Future Appointments Provider Department Dept Phone   05/11/2014 9:45 AM Gardiner Barefootobert W Comer, MD [facility] [phone]   Future Orders Complete By Expires   Call MD / Call 911  As directed    Constipation Prevention  As directed    Discharge instructions  As directed    Driving restrictions  As directed    Follow the hip precautions as taught in  Physical Therapy  As directed    Increase activity slowly as tolerated  As directed         Medication List    STOP taking these medications       cephALEXin 500 MG capsule  Commonly known as:  KEFLEX      TAKE these medications       atorvastatin 40 MG tablet  Commonly known as:  LIPITOR  Take 40 mg by mouth every morning.     DSS 100 MG Caps  Take 100 mg by mouth 2 (two) times daily.     indomethacin 50 MG capsule  Commonly known as:  INDOCIN  Take 50 mg by mouth 2 (two) times daily with a meal.     lisinopril 40 MG tablet  Commonly known as:  PRINIVIL,ZESTRIL  Take 40 mg by mouth every morning.     omeprazole 40 MG capsule  Commonly known as:  PRILOSEC  Take 40 mg by mouth daily.     polyethylene glycol packet  Commonly known as:  MIRALAX / GLYCOLAX  Take 17 g by mouth daily as needed for mild constipation.     verapamil 100 MG 24 hr capsule  Commonly known as:  VERELAN  Take 100 mg by mouth every morning.         Signed: Anastasio AuerbachMatthew S. Sidni Fusco   PAC  04/26/2014, 9:41 AM

## 2014-04-30 ENCOUNTER — Encounter (HOSPITAL_COMMUNITY): Payer: Self-pay | Admitting: Orthopedic Surgery

## 2014-04-30 NOTE — OR Nursing (Signed)
Update on 04/30/2014 to reflect wound class and procedure end. Raymond AddisonShareen Rielynn [name]

## 2014-05-03 ENCOUNTER — Telehealth: Payer: Self-pay | Admitting: *Deleted

## 2014-05-03 NOTE — Telephone Encounter (Signed)
Lyn, RN with [facility] called to report a critical vanc trough of 46.9 and creatinine of 2.86 from 05/02/14/ Vancomycin has been held over the weekend and today. Home Health nurse to repeat labs today before restarting antibiotic and may go to vanc once daily. Dr. Luciana Axeomer was paged over the weekend and is aware of these results.

## 2014-05-05 ENCOUNTER — Telehealth: Payer: Self-pay

## 2014-05-05 NOTE — Telephone Encounter (Signed)
Pharmacist, Larita FifeLynn at lunch.  Left message to call our office once he returns.   Laurell Josephsammy K King , RN

## 2014-05-06 NOTE — Telephone Encounter (Signed)
Spoke with Larita FifeLynn today.  Pt started vancomycin 1500 mg q 12 on 5/6.  Labs 5/17: Vanc trough 46.9, creatinine 2.86.  Vancomycin was stopped.  Repeat labs 5/18: vanc trough 26.6, creatinine 2.6.  Patient is still holding vanc. Patient takes daily aspirin 325 mg, occasional ibuprofen 200 mg-400 mg for pain.  Larita FifeLynn advised patient to use ibuprofen sparingly, will get repeat labs morning of 5/22.  Pt denies any symptoms of vancomycin toxicity. Pt knows to continue holding vancomycin until directed otherwise. Andree CossMichelle M Sheriann Newmann, RN

## 2014-05-11 ENCOUNTER — Telehealth: Payer: Self-pay | Admitting: *Deleted

## 2014-05-11 ENCOUNTER — Encounter: Payer: Self-pay | Admitting: Internal Medicine

## 2014-05-11 ENCOUNTER — Ambulatory Visit (INDEPENDENT_AMBULATORY_CARE_PROVIDER_SITE_OTHER): Payer: BC Managed Care – PPO | Admitting: Internal Medicine

## 2014-05-11 VITALS — BP 130/87 | HR 103 | Temp 98.6°F | Wt 185.0 lb

## 2014-05-11 DIAGNOSIS — T8140XA Infection following a procedure, unspecified, initial encounter: Secondary | ICD-10-CM

## 2014-05-11 DIAGNOSIS — T8149XA Infection following a procedure, other surgical site, initial encounter: Secondary | ICD-10-CM

## 2014-05-11 LAB — BASIC METABOLIC PANEL WITH GFR
BUN: 18 mg/dL (ref 6–23)
CO2: 20 mEq/L (ref 19–32)
CREATININE: 1.9 mg/dL — AB (ref 0.50–1.35)
Calcium: 10 mg/dL (ref 8.4–10.5)
Chloride: 100 mEq/L (ref 96–112)
GFR, Est African American: 47 mL/min — ABNORMAL LOW
GFR, Est Non African American: 41 mL/min — ABNORMAL LOW
Glucose, Bld: 208 mg/dL — ABNORMAL HIGH (ref 70–99)
Potassium: 4.9 mEq/L (ref 3.5–5.3)
Sodium: 130 mEq/L — ABNORMAL LOW (ref 135–145)

## 2014-05-11 LAB — C-REACTIVE PROTEIN: CRP: <0.5 mg/dL (ref <0.60)

## 2014-05-11 LAB — SEDIMENTATION RATE: Sed Rate: 30 mm/hr — ABNORMAL HIGH (ref 0–16)

## 2014-05-11 MED ORDER — DOXYCYCLINE HYCLATE 100 MG PO TABS: 100.0000 mg | ORAL_TABLET | Freq: Two times a day (BID) | ORAL | Status: AC

## 2014-05-11 MED ORDER — AMOXICILLIN-POT CLAVULANATE 875-125 MG PO TABS: 1.0000 | ORAL_TABLET | Freq: Two times a day (BID) | ORAL | Status: AC

## 2014-05-11 NOTE — Telephone Encounter (Signed)
Notified Raymond [name] at [facility] that picc line has been pulled in clinic today and patient will be starting oral antibiotics. Wendall Mola

## 2014-05-11 NOTE — Assessment & Plan Note (Signed)
I discussed the different options, pros and cons including contiuing with IV antibiotics vs oral options with close follow up.  Due to cost issues, he has opted for oral therapy with the caveat that we may need to switch to IV again if there are any concerns and replacing the picc line.     I will change him to doxy + Augmentin and check inflammatory markers today.  He will return in one week and see if he is doing well.  Will plan on 1 month of oral therapy, depending on CRP and ESR.

## 2014-05-11 NOTE — Progress Notes (Signed)
   Subjective:    Patient ID: Raymond [name], male    DOB: 06/07/1966, 48 y.o.   MRN: [id number]  HPI Here for hospital follow up.  I saw him previously last year for cellulitis following THA with hematoma and he completed a 6 week course of cefazolin with concern for deep infection.  He then was started on cefazlexin by Dr. Charlann Boxer following the IV antibiotics and had been on them until his recent left THA and developed cellulitis and superficial wound after discharge.  He did undergo debridement after noting increased redness and pain of right hip and pus was noted above fascial layer.  No positive cultures ever grew.  He was started on vancomycin for a projected 4-6 week course (through at least June 1).      Unfortunately, his vancomycin trough went up to 49 and creatinine up to 2.86 and has been off of vancomycin since.  Recent trough down to 5 now but has not yet restarted.  He is having more pain in his hip but incision closed, no drainage, no erythema.  Unable to take NSAIDS now with increased creat.  Otherwise no new issues.     Review of Systems  Constitutional: Negative for fever, chills and fatigue.  Gastrointestinal: Negative for nausea and diarrhea.  Genitourinary: Negative for decreased urine volume and difficulty urinating.  Skin: Negative for rash.  Neurological: Negative for dizziness.  Hematological: Negative for adenopathy.       Objective:   Physical Exam  Constitutional: He appears well-developed and well-nourished. No distress.  HENT:  Mouth/Throat: No oropharyngeal exudate.  Eyes: No scleral icterus.  Cardiovascular: Normal rate, regular rhythm and normal heart sounds.   No murmur heard. Skin: No rash noted.          Assessment & Plan:

## 2014-05-18 ENCOUNTER — Ambulatory Visit (INDEPENDENT_AMBULATORY_CARE_PROVIDER_SITE_OTHER): Payer: BC Managed Care – PPO | Admitting: Internal Medicine

## 2014-05-18 ENCOUNTER — Encounter: Payer: Self-pay | Admitting: Internal Medicine

## 2014-05-18 VITALS — BP 137/96 | HR 116 | Temp 98.9°F | Ht 72.0 in | Wt 187.0 lb

## 2014-05-18 DIAGNOSIS — T8149XA Infection following a procedure, other surgical site, initial encounter: Secondary | ICD-10-CM

## 2014-05-18 DIAGNOSIS — T8140XA Infection following a procedure, unspecified, initial encounter: Secondary | ICD-10-CM

## 2014-05-18 NOTE — Progress Notes (Signed)
   Subjective:    Patient ID: Raymond [name], male    DOB: 12/12/1966, 48 y.o.   MRN: [id number]  HPI  Here for hospital follow up.  I saw him previously last year for cellulitis following THA with hematoma and he completed a 6 week course of cefazolin with concern for deep infection.  He then was started on cefazlexin by Dr. Alvan Dame following the IV antibiotics and had been on them until his recent left THA and developed cellulitis and superficial wound after discharge.  He did undergo debridement after noting increased redness and pain of right hip and pus was noted above fascial layer.  No positive cultures ever grew.  He was started on vancomycin for a projected 4-6 week course (through at least June 1).      Unfortunately, his vancomycin trough went up to 49 and creatinine up to 2.86 and has been off of vancomycin since.  I then changed him to oral therapy with doxycycline with Augmentin. CRP and ESR were checked and were <0.5 and 30.  Creat was stable at 1.90.     Review of Systems  Constitutional: Negative for fever, chills and fatigue.  Gastrointestinal: Negative for nausea and diarrhea.  Genitourinary: Negative for decreased urine volume and difficulty urinating.  Skin: Negative for rash.  Neurological: Negative for dizziness.  Hematological: Negative for adenopathy.       Objective:   Physical Exam  Constitutional: He appears well-developed and well-nourished. No distress.  HENT:  Mouth/Throat: No oropharyngeal exudate.  Eyes: No scleral icterus.  Cardiovascular: Normal rate, regular rhythm and normal heart sounds.   No murmur heard. Skin: No rash noted.          Assessment & Plan:

## 2014-05-18 NOTE — Assessment & Plan Note (Signed)
Doing well now.  Will continue for 1 month through about 6/26 and stop.  RTC 3 weeks at end of treatment.

## 2014-05-31 ENCOUNTER — Encounter: Payer: Self-pay | Admitting: Internal Medicine

## 2014-06-08 ENCOUNTER — Ambulatory Visit: Payer: BC Managed Care – PPO | Admitting: Internal Medicine

## 2014-07-18 IMAGING — CR DG CHEST 2V
2 series · 2 of 2 positions shown · non-contrast
Comparison: None.

CLINICAL DATA: Preoperative evaluation; hypertension

CHEST - 2 VIEW

[w chest pa]
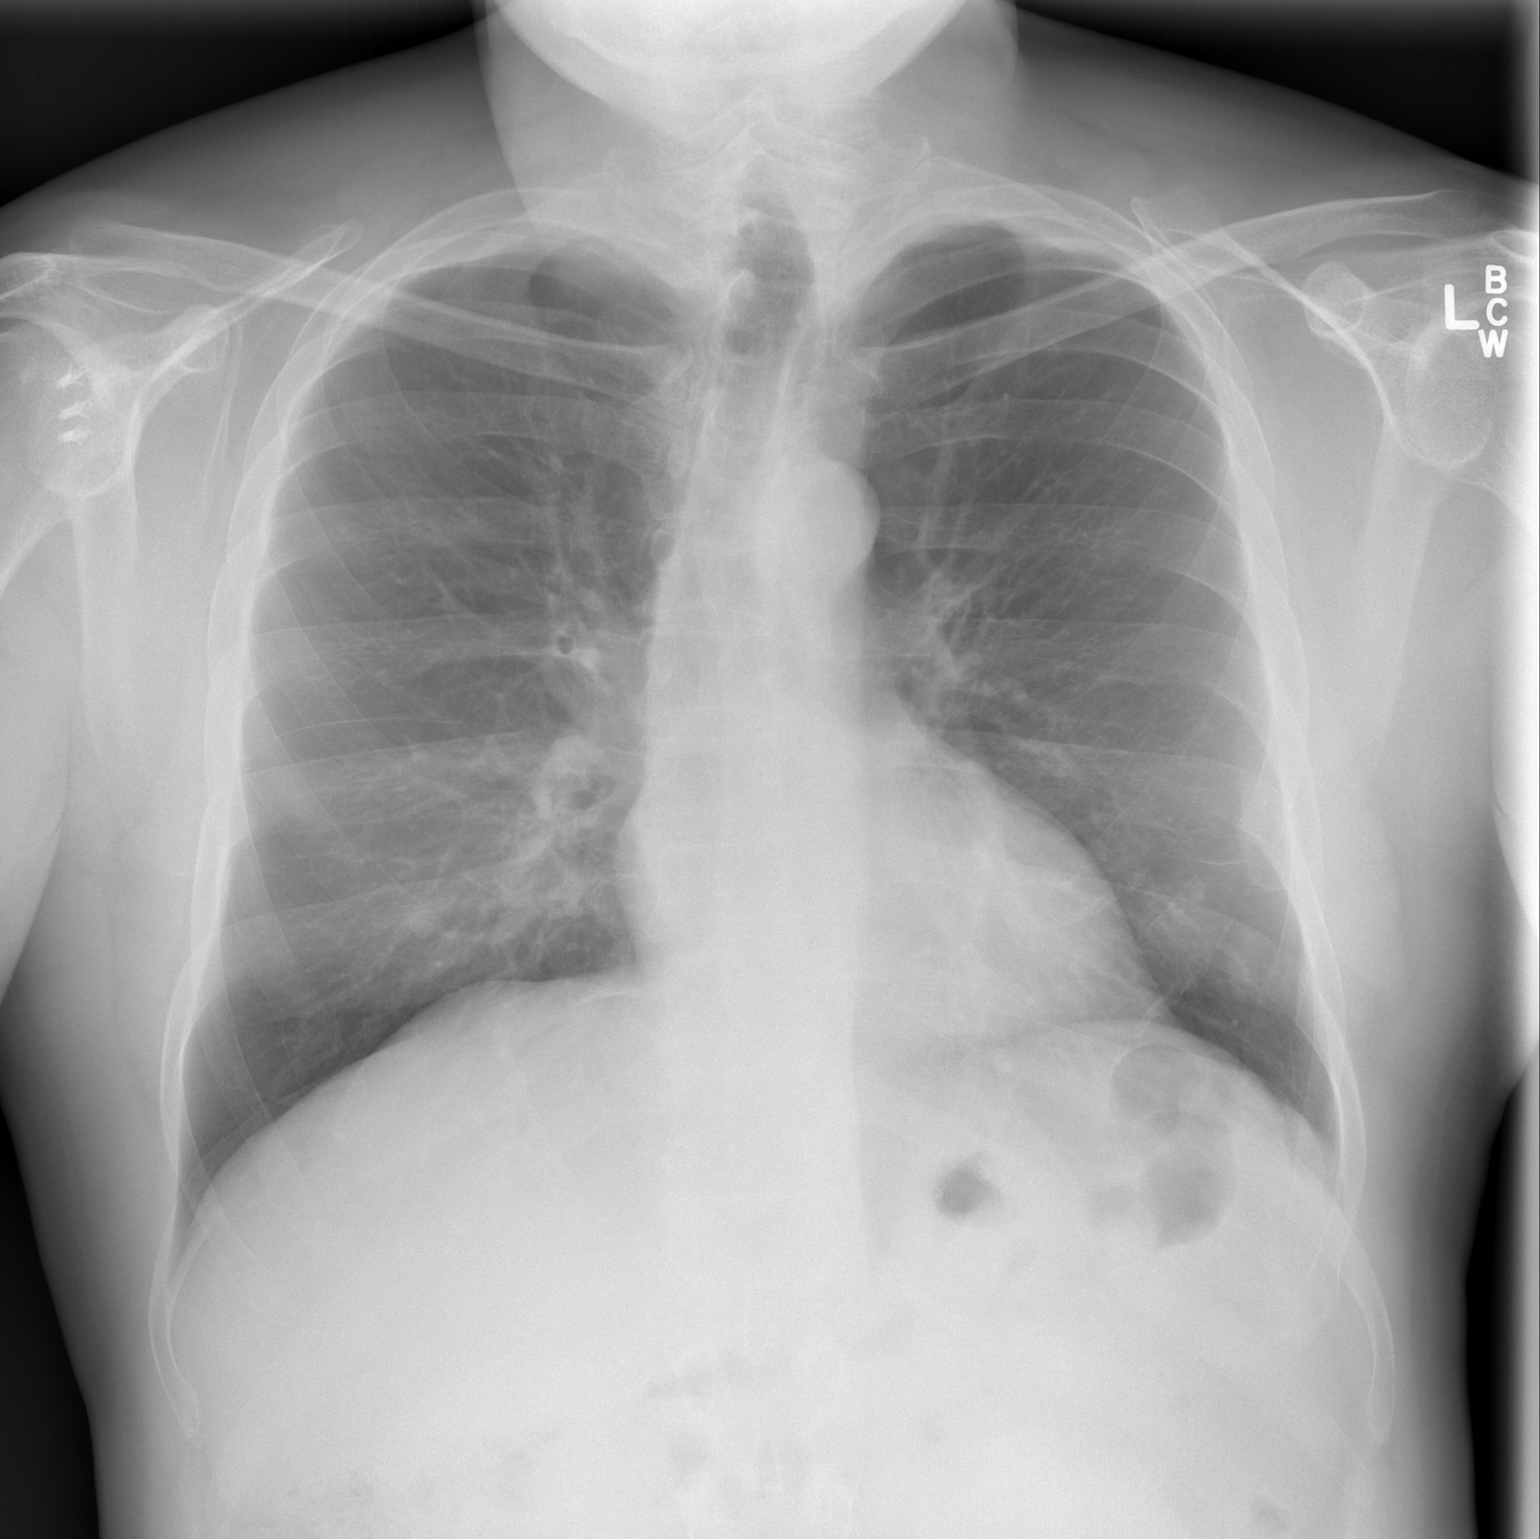

[w chest lat]
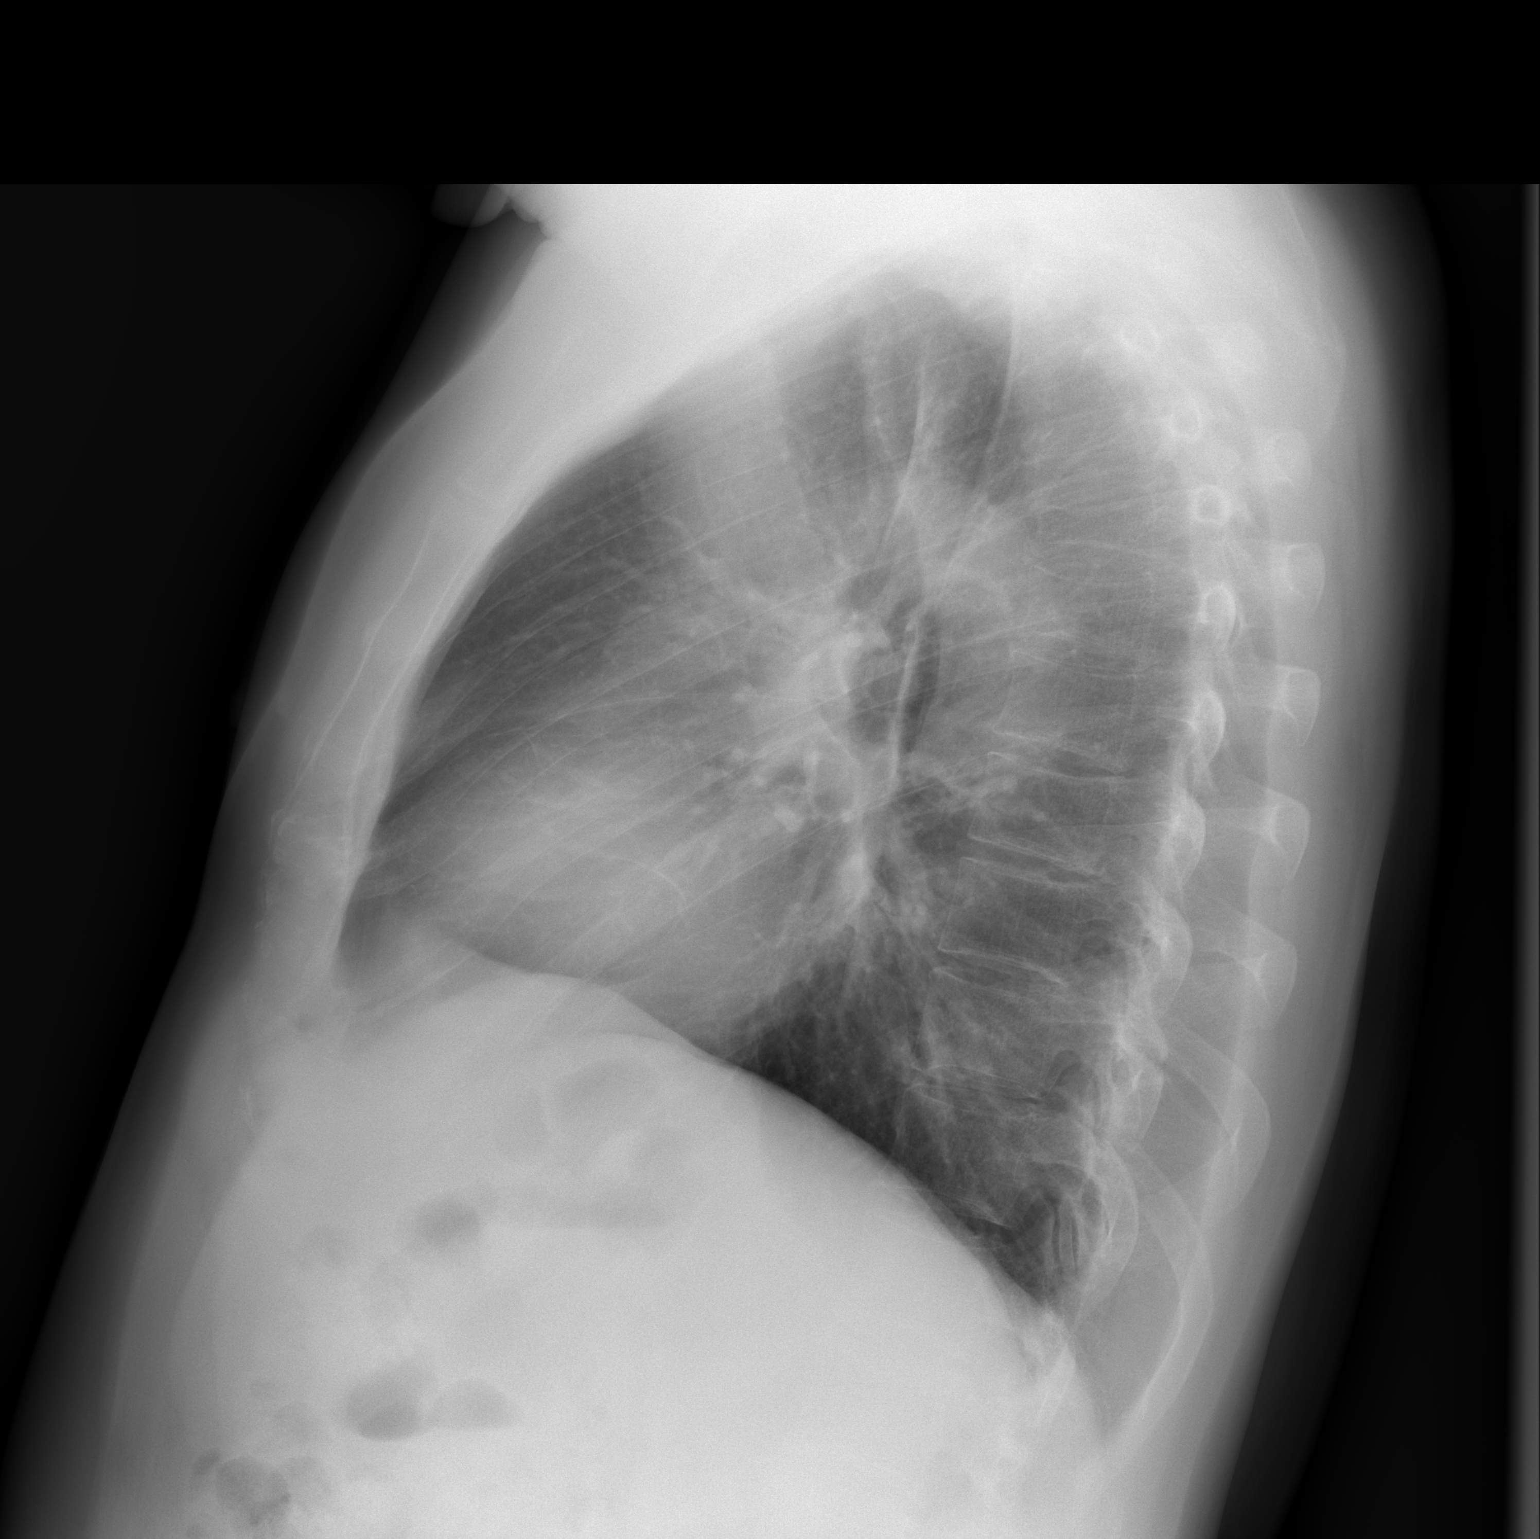

[2 of 2 positions shown; findings below may reference images not displayed]

FINDINGS: There is a mild scarring in the left base.  Lungs
elsewhere clear.  Heart size and pulmonary vascularity are normal.
No adenopathy.

There is anterior wedging of an upper thoracic vertebral body of
uncertain age.  There is postoperative change in the right
shoulder.
IMPRESSION: No edema or consolidation.  Mild scarring left base.  Age uncertain
anterior wedging of an upper thoracic vertebral body.

## 2014-07-22 IMAGING — CR DG PORTABLE PELVIS
1 series · 1 of 1 positions shown · non-contrast
Comparison: Cross-table lateral left hip radiographs - earlier same
day

CLINICAL DATA: Postop left total hip replacement

PORTABLE PELVIS

[AP]
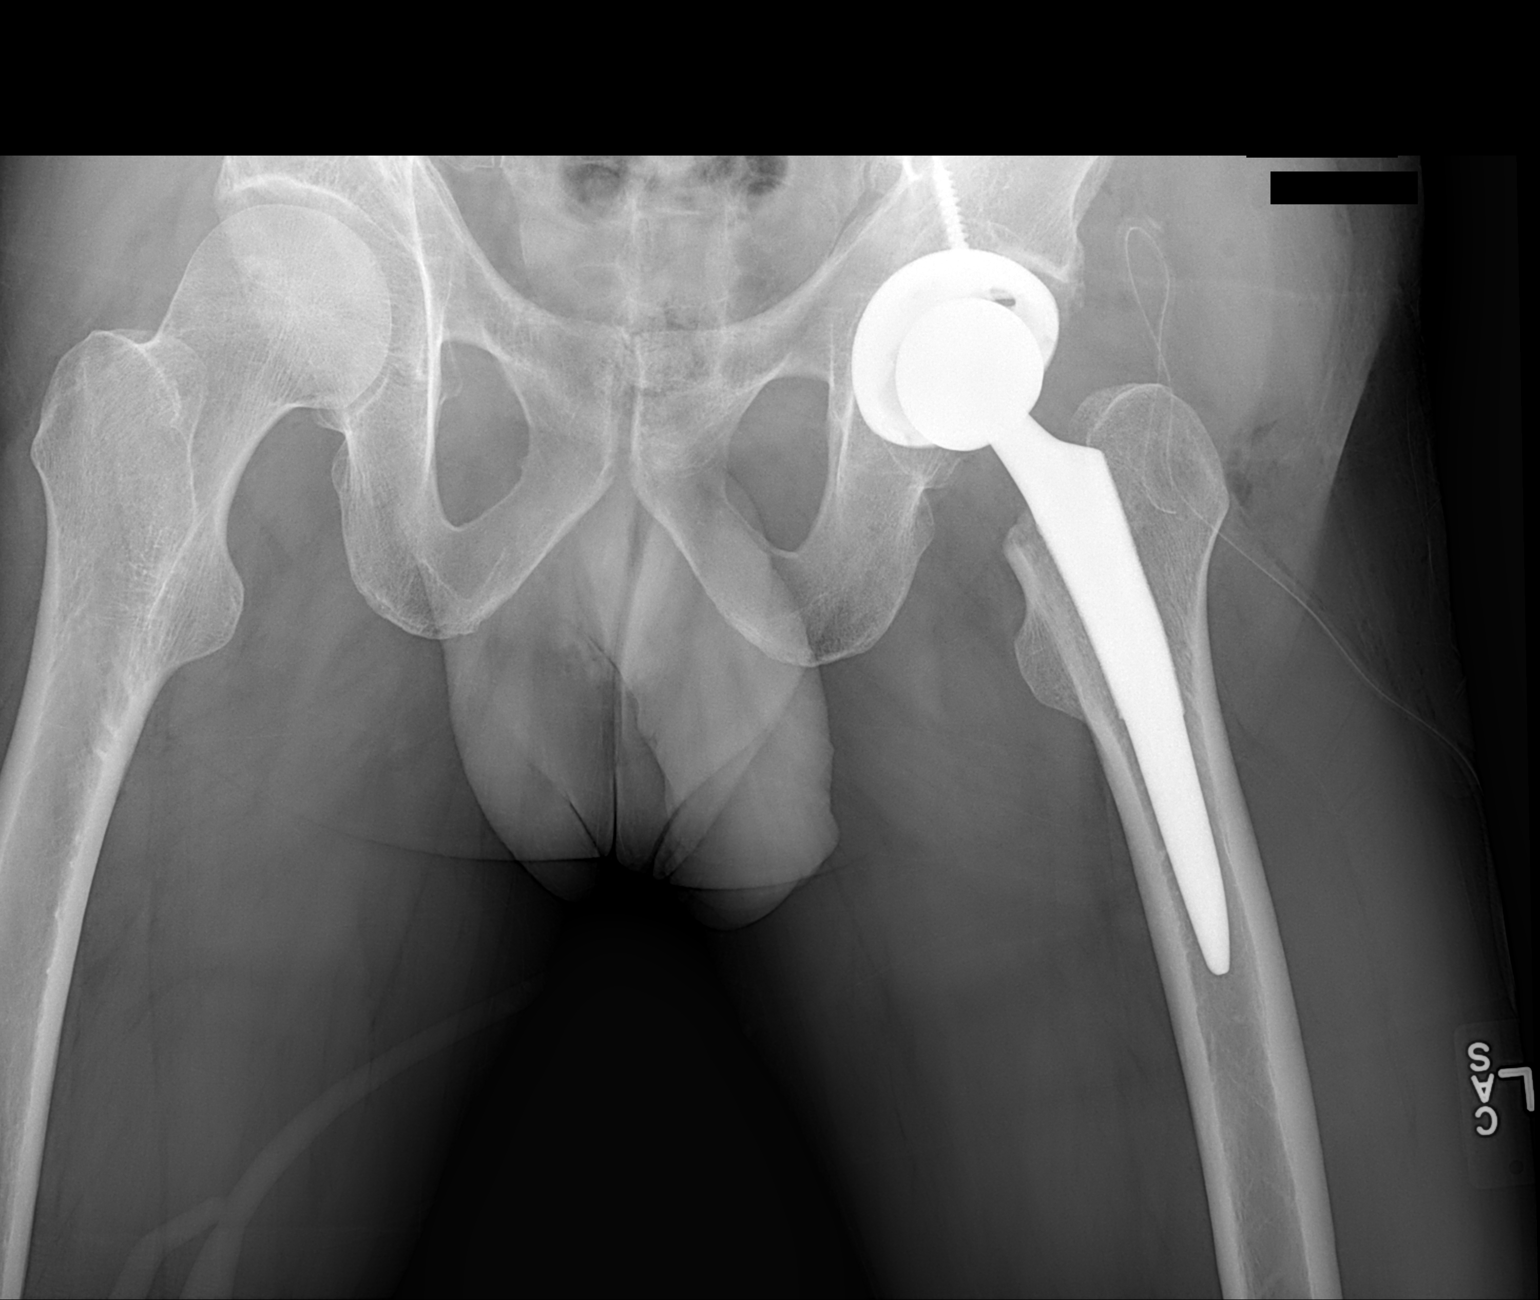

[1 of 1 positions shown; findings below may reference images not displayed]

FINDINGS: This examination is interpreted in conjunction with cross-table
lateral hip radiographs performed earlier same day.

Post left total hip replacement.  No evidence for hardware failure
or loosening. Alignment appears near anatomic.  No fracture or
dislocation.  A surgical drain is noted about the operative site
with scattered foci of subcutaneous emphysema.  No radiopaque
foreign body.
IMPRESSION: Post left total hip replacement without evidence of complication.

## 2014-07-22 IMAGING — CR DG HIP 1V PORT*L*
1 series · 1 of 1 positions shown · non-contrast
Comparison: AP pelvic radiograph - earlier same day

CLINICAL DATA: Postop left total hip replacement

PORTABLE LEFT HIP - 1 VIEW

[AP]
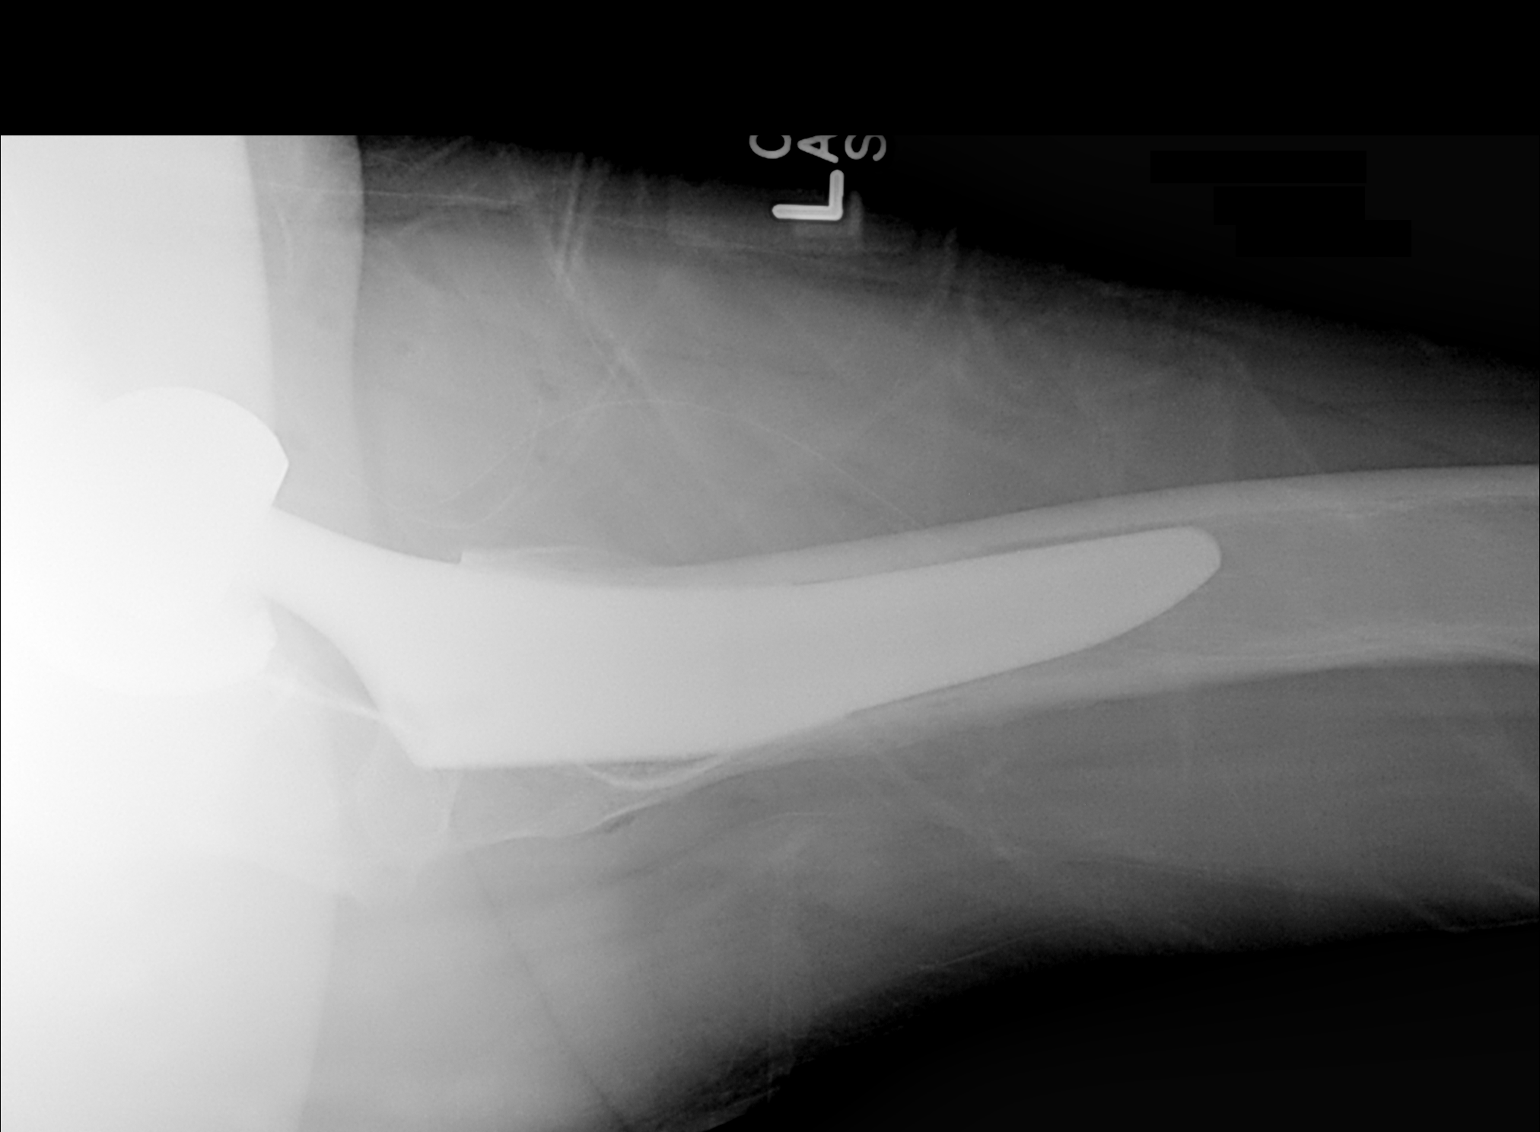

[1 of 1 positions shown; findings below may reference images not displayed]

FINDINGS: This examination was interpreted in conjunction with the P pelvic
radiograph performed earlier same day.

Post left total hip replacement.  No evidence for hardware failure
or loosening. Alignment appears near anatomic.  No fracture or
dislocation.  A surgical drain is noted about the operative site
with scattered foci of subcutaneous emphysema.  No radiopaque
foreign body.
IMPRESSION: Post left total hip replacement without evidence of complication.

## 2014-08-12 IMAGING — CR DG HIP (WITH OR WITHOUT PELVIS) 2-3V*L*
2 series · 2 of 2 positions shown · non-contrast
Comparison: 08/02/2013; 07/28/2013

CLINICAL DATA: Post left total hip replacement, now with soft
tissue swelling, evaluate for abscess

LEFT HIP - COMPLETE 2+ VIEW

[t hip ap left]
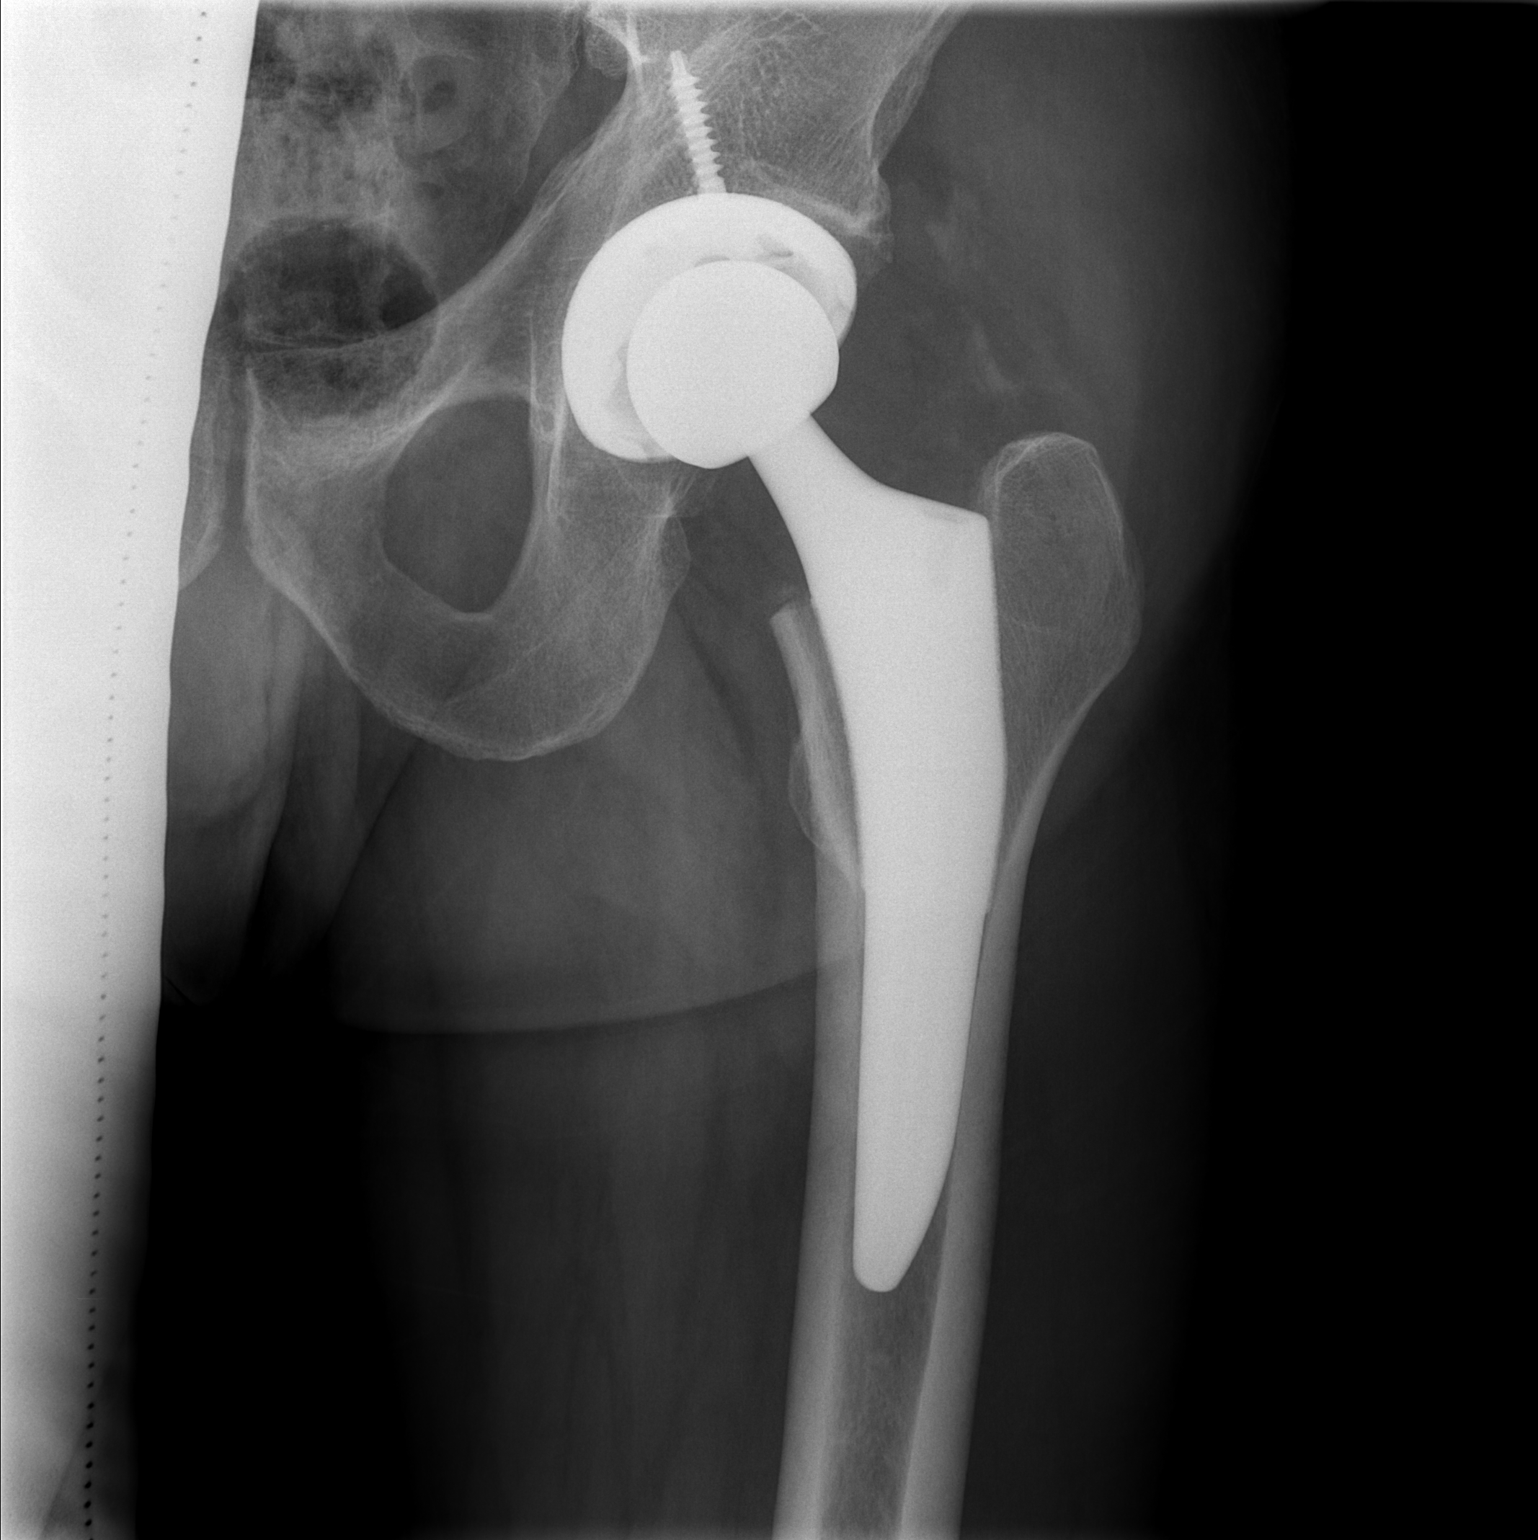

[t hip frog leg left]
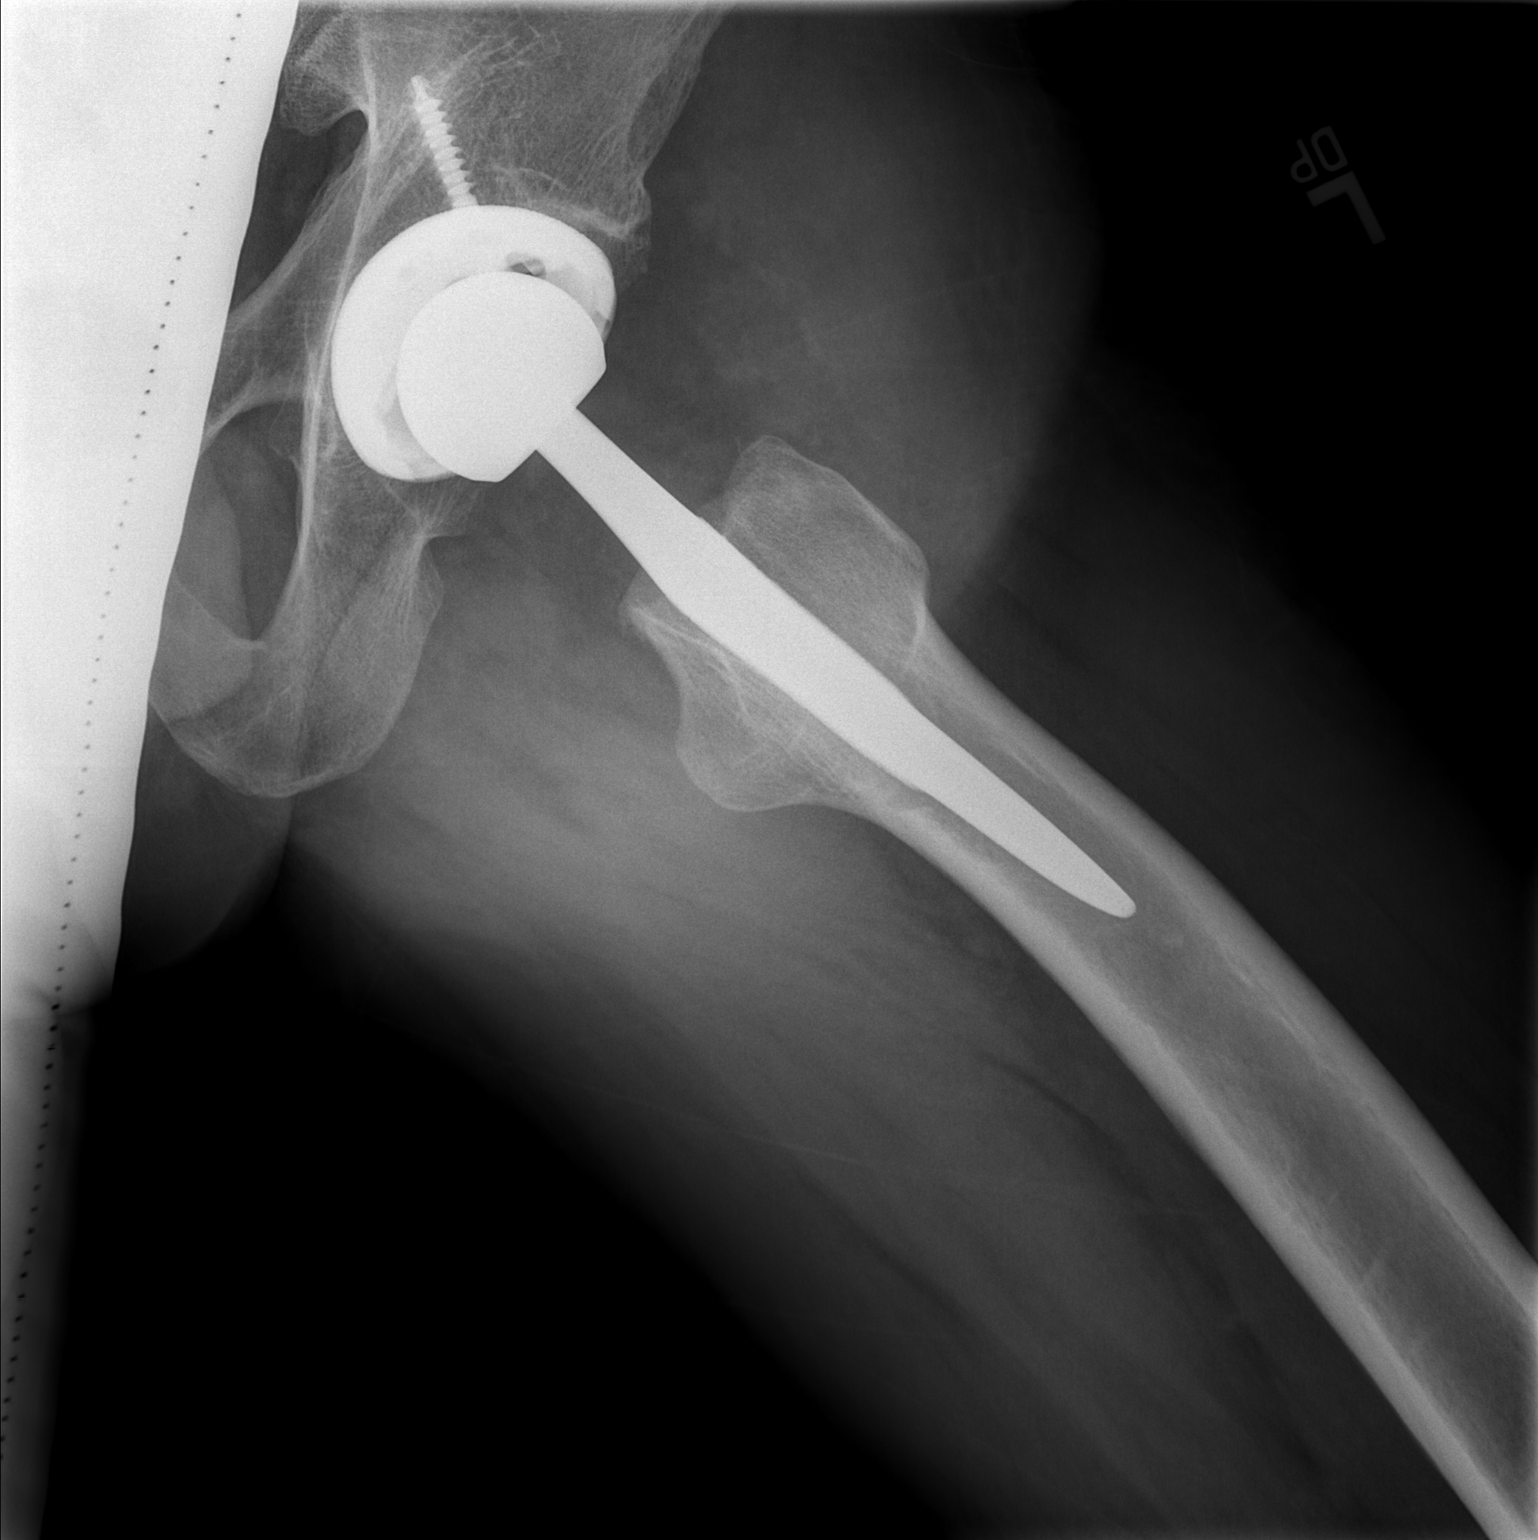

[2 of 2 positions shown; findings below may reference images not displayed]

FINDINGS: Post left total hip replacement without evidence of hardware
failure or loosening.  No fracture or dislocation.

The regional soft tissues are normal.  No radiopaque foreign body.
No discrete area of subcutaneous emphysema.  No discrete area of
osteolysis to suggest osteomyelitis.
IMPRESSION: Post left total hip replacement without evidence of complication.

## 2017-08-17 DEATH — deceased
